# Patient Record
Sex: Female | Born: 1965
Health system: Southern US, Community
[De-identification: ages and names within clinical notes are randomized; demographics above are authoritative.]

---

## 2000-07-18 ENCOUNTER — Encounter (INDEPENDENT_AMBULATORY_CARE_PROVIDER_SITE_OTHER): Payer: Self-pay

## 2000-07-18 ENCOUNTER — Other Ambulatory Visit: Admission: RE | Admit: 2000-07-18 | Discharge: 2000-07-18 | Payer: Self-pay | Admitting: Obstetrics

## 2008-06-19 ENCOUNTER — Ambulatory Visit (HOSPITAL_COMMUNITY): Admission: RE | Admit: 2008-06-19 | Discharge: 2008-06-19 | Payer: Self-pay | Admitting: Obstetrics and Gynecology

## 2008-07-18 ENCOUNTER — Ambulatory Visit (HOSPITAL_COMMUNITY): Admission: RE | Admit: 2008-07-18 | Discharge: 2008-07-18 | Payer: Self-pay | Admitting: Obstetrics and Gynecology

## 2008-07-24 ENCOUNTER — Ambulatory Visit (HOSPITAL_COMMUNITY): Admission: RE | Admit: 2008-07-24 | Discharge: 2008-07-24 | Payer: Self-pay | Admitting: Obstetrics and Gynecology

## 2008-08-08 ENCOUNTER — Ambulatory Visit (HOSPITAL_COMMUNITY): Admission: RE | Admit: 2008-08-08 | Discharge: 2008-08-08 | Payer: Self-pay | Admitting: Obstetrics and Gynecology

## 2008-08-09 ENCOUNTER — Ambulatory Visit (HOSPITAL_COMMUNITY): Admission: RE | Admit: 2008-08-09 | Discharge: 2008-08-09 | Payer: Self-pay | Admitting: Obstetrics and Gynecology

## 2008-08-14 ENCOUNTER — Ambulatory Visit (HOSPITAL_COMMUNITY): Admission: RE | Admit: 2008-08-14 | Discharge: 2008-08-14 | Payer: Self-pay | Admitting: Obstetrics and Gynecology

## 2008-08-16 ENCOUNTER — Ambulatory Visit (HOSPITAL_COMMUNITY): Admission: RE | Admit: 2008-08-16 | Discharge: 2008-08-16 | Payer: Self-pay | Admitting: Obstetrics and Gynecology

## 2008-08-19 ENCOUNTER — Ambulatory Visit (HOSPITAL_COMMUNITY): Admission: RE | Admit: 2008-08-19 | Discharge: 2008-08-19 | Payer: Self-pay | Admitting: Obstetrics and Gynecology

## 2008-08-22 ENCOUNTER — Ambulatory Visit (HOSPITAL_COMMUNITY): Admission: RE | Admit: 2008-08-22 | Discharge: 2008-08-22 | Payer: Self-pay | Admitting: Obstetrics and Gynecology

## 2008-08-26 ENCOUNTER — Ambulatory Visit (HOSPITAL_COMMUNITY): Admission: RE | Admit: 2008-08-26 | Discharge: 2008-08-26 | Payer: Self-pay | Admitting: Obstetrics and Gynecology

## 2008-08-29 ENCOUNTER — Ambulatory Visit (HOSPITAL_COMMUNITY): Admission: RE | Admit: 2008-08-29 | Discharge: 2008-08-29 | Payer: Self-pay | Admitting: Obstetrics and Gynecology

## 2008-09-02 ENCOUNTER — Ambulatory Visit (HOSPITAL_COMMUNITY): Admission: RE | Admit: 2008-09-02 | Discharge: 2008-09-02 | Payer: Self-pay | Admitting: Obstetrics and Gynecology

## 2008-09-05 ENCOUNTER — Ambulatory Visit (HOSPITAL_COMMUNITY): Admission: RE | Admit: 2008-09-05 | Discharge: 2008-09-05 | Payer: Self-pay | Admitting: Obstetrics and Gynecology

## 2008-09-05 ENCOUNTER — Ambulatory Visit: Payer: Self-pay | Admitting: Obstetrics & Gynecology

## 2008-09-12 ENCOUNTER — Ambulatory Visit (HOSPITAL_COMMUNITY): Admission: RE | Admit: 2008-09-12 | Discharge: 2008-09-12 | Payer: Self-pay | Admitting: Obstetrics and Gynecology

## 2008-09-12 ENCOUNTER — Ambulatory Visit: Payer: Self-pay | Admitting: Family Medicine

## 2008-09-17 ENCOUNTER — Inpatient Hospital Stay (HOSPITAL_COMMUNITY): Admission: AD | Admit: 2008-09-17 | Discharge: 2008-09-19 | Payer: Self-pay | Admitting: Obstetrics & Gynecology

## 2008-09-17 ENCOUNTER — Ambulatory Visit: Payer: Self-pay | Admitting: Obstetrics & Gynecology

## 2009-04-26 ENCOUNTER — Emergency Department (HOSPITAL_COMMUNITY): Admission: EM | Admit: 2009-04-26 | Discharge: 2009-04-26 | Payer: Self-pay | Admitting: Emergency Medicine

## 2010-04-15 LAB — URINE CULTURE

## 2010-04-15 LAB — URINALYSIS, ROUTINE W REFLEX MICROSCOPIC
Glucose, UA: NEGATIVE mg/dL
Ketones, ur: NEGATIVE mg/dL
Nitrite: NEGATIVE
Protein, ur: 30 mg/dL — AB

## 2010-04-15 LAB — GC/CHLAMYDIA PROBE AMP, GENITAL
Chlamydia, DNA Probe: NEGATIVE
GC Probe Amp, Genital: NEGATIVE

## 2010-04-15 LAB — URINE MICROSCOPIC-ADD ON

## 2010-05-02 LAB — CBC
HCT: 35.7 % — ABNORMAL LOW (ref 36.0–46.0)
Hemoglobin: 12.1 g/dL (ref 12.0–15.0)
MCHC: 34 g/dL (ref 30.0–36.0)
MCV: 89.4 fL (ref 78.0–100.0)
MCV: 90.1 fL (ref 78.0–100.0)
RBC: 3.78 MIL/uL — ABNORMAL LOW (ref 3.87–5.11)
RBC: 4 MIL/uL (ref 3.87–5.11)
RDW: 15.2 % (ref 11.5–15.5)
WBC: 16 10*3/uL — ABNORMAL HIGH (ref 4.0–10.5)

## 2010-05-02 LAB — POCT URINALYSIS DIP (DEVICE)
Bilirubin Urine: NEGATIVE
Bilirubin Urine: NEGATIVE
Glucose, UA: NEGATIVE mg/dL
Glucose, UA: NEGATIVE mg/dL
Hgb urine dipstick: NEGATIVE
Hgb urine dipstick: NEGATIVE
Ketones, ur: NEGATIVE mg/dL
Ketones, ur: NEGATIVE mg/dL
Nitrite: NEGATIVE
Specific Gravity, Urine: 1.015 (ref 1.005–1.030)
Specific Gravity, Urine: 1.015 (ref 1.005–1.030)
pH: 8 (ref 5.0–8.0)

## 2010-10-30 IMAGING — US US OB FOLLOW-UP
1 series · 14 of 28 positions shown · non-contrast
Comparison: none

OBSTETRICAL ULTRASOUND:
 This ultrasound was performed in The [HOSPITAL], and the [facility] report will be stored to [REDACTED] PACS.

[Series 1: us ob follow-up · 14 of 39 slices shown]
[im 2/39]
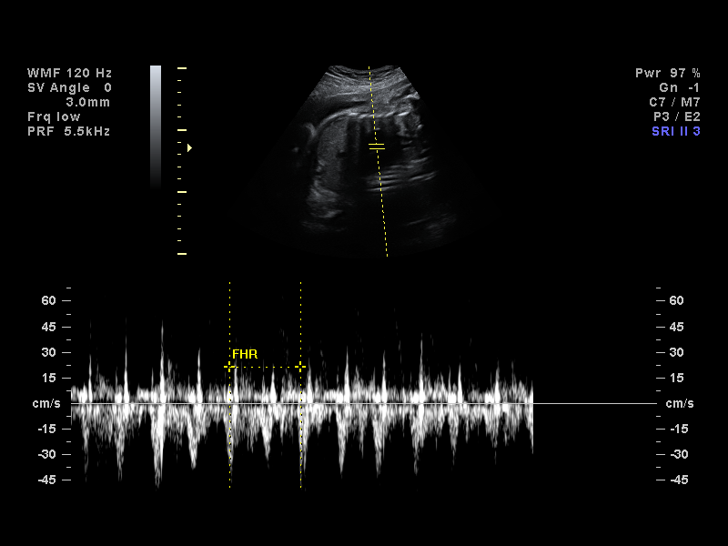
[im 5/39]
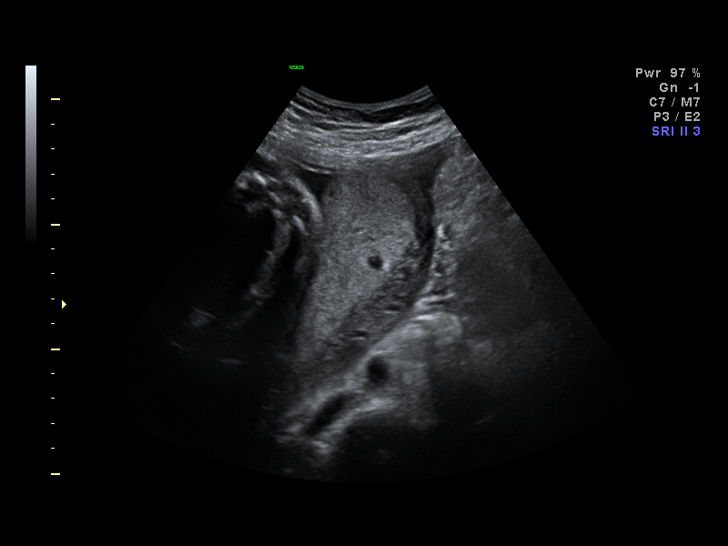
[im 8/39]
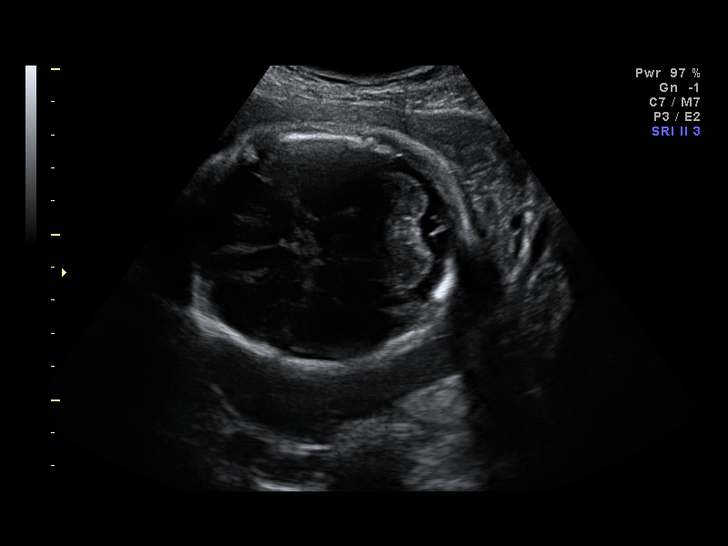
[im 10/39]
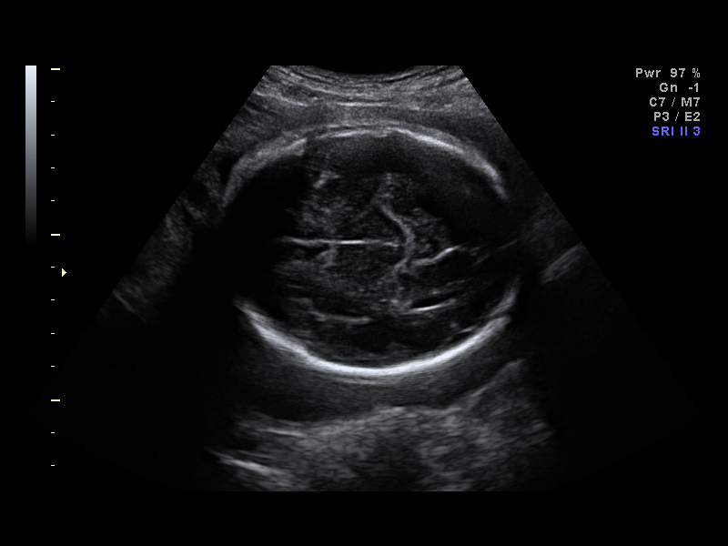
[im 13/39]
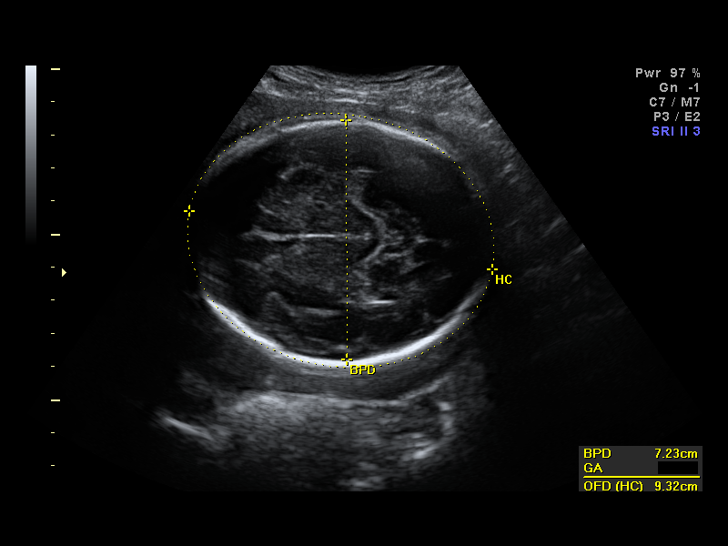
[im 16/39]
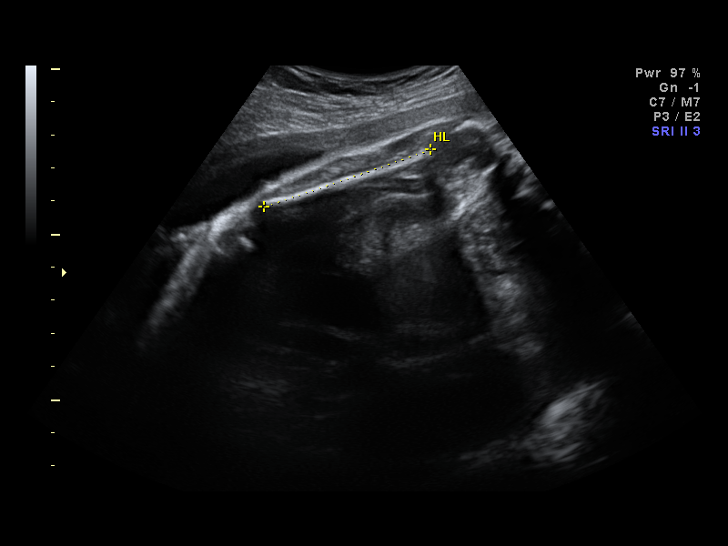
[im 19/39]
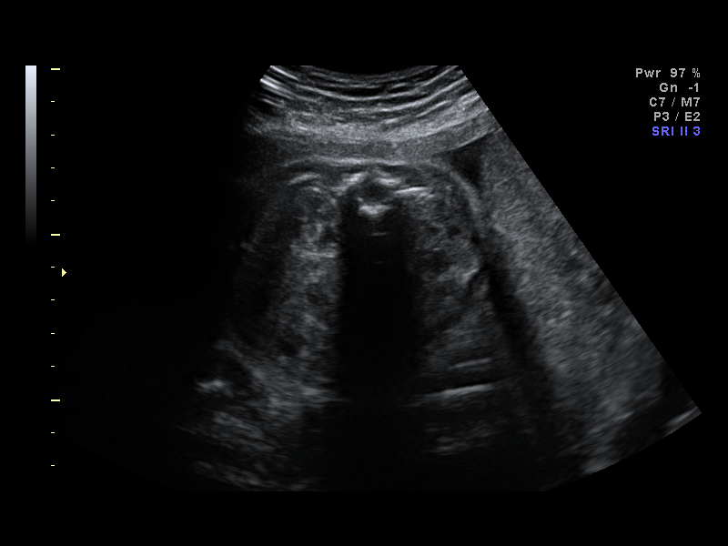
[im 22/39]
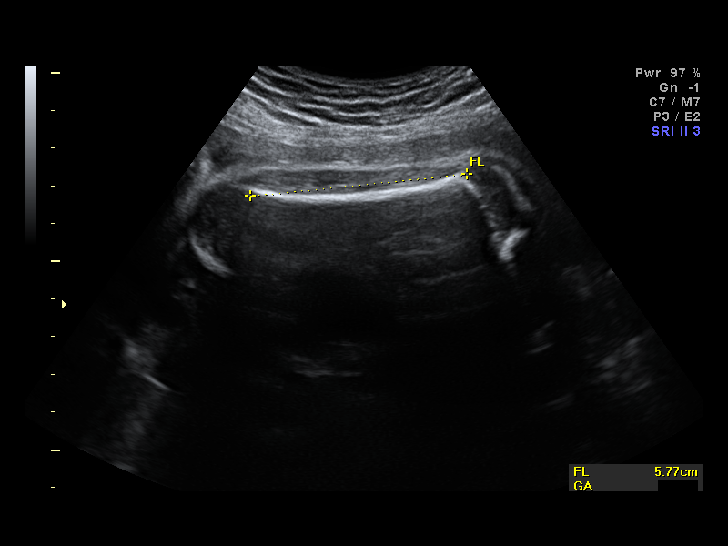
[im 24/39]
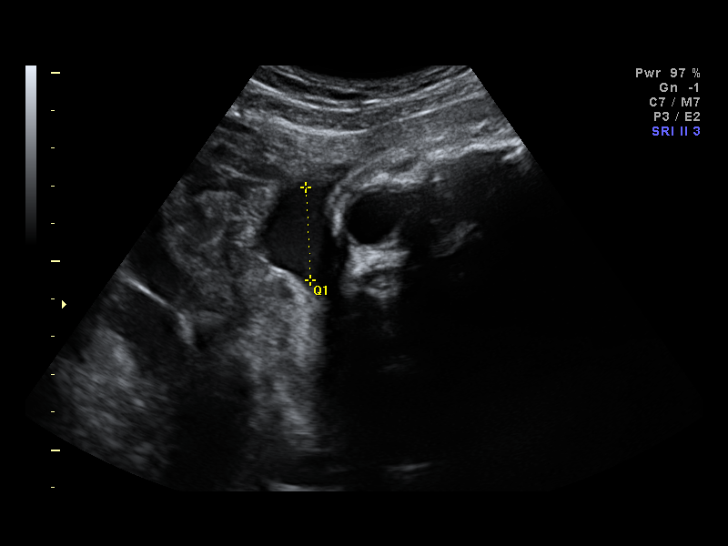
[im 27/39]
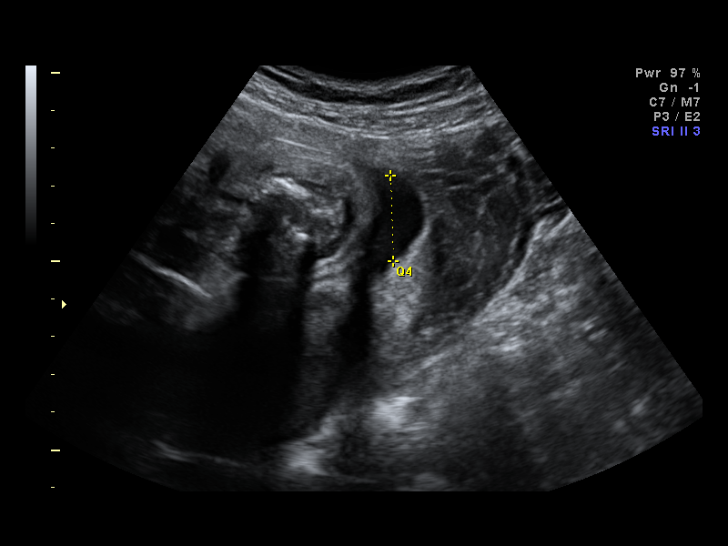
[im 30/39]
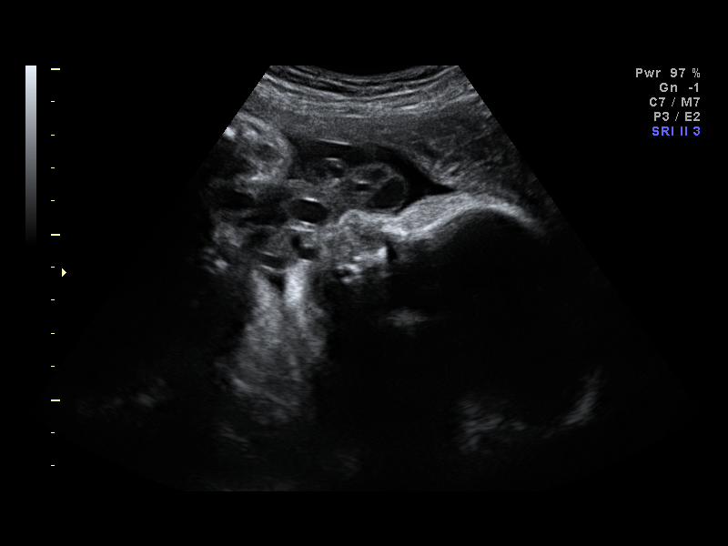
[im 33/39]
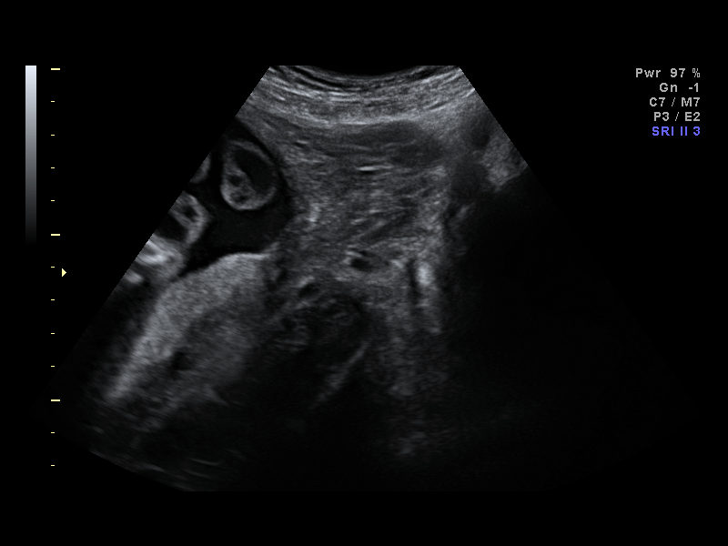
[im 36/39]
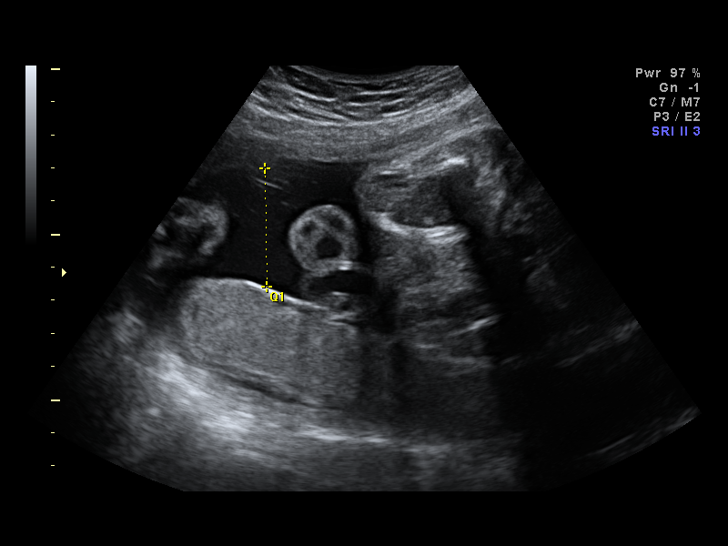
[im 39/39]
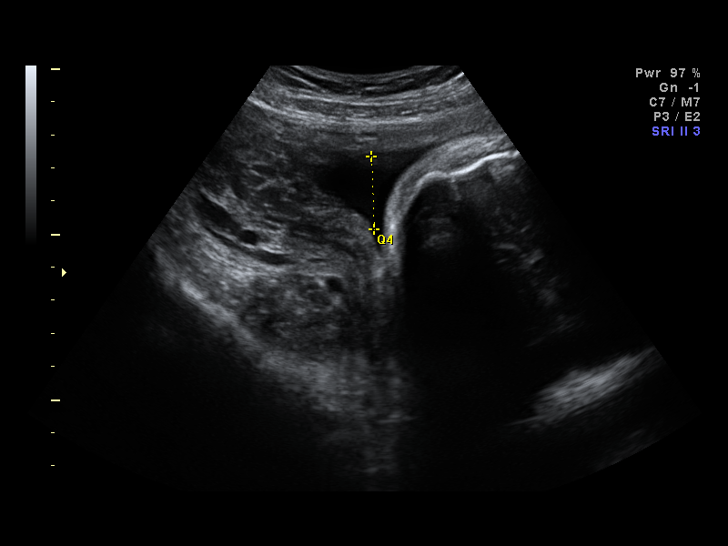

[14 of 28 positions shown; findings below may reference images not displayed]

IMPRESSION: [facility] has also been faxed to the ordering physician.

## 2010-11-20 IMAGING — US US OB FOLLOW-UP
1 series · 14 of 28 positions shown · non-contrast
Comparison: none

OBSTETRICAL ULTRASOUND:
 This ultrasound was performed in The [[facility]OSP[facility]], and the [facility] report will be stored to [REDACTED] PACS.

[Series 1: us ob follow-up · 14 of 33 slices shown]
[im 2/33]
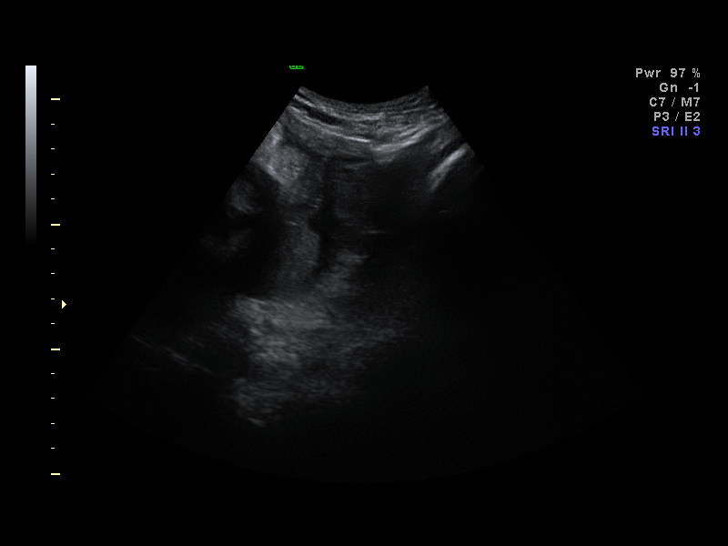
[im 4/33]
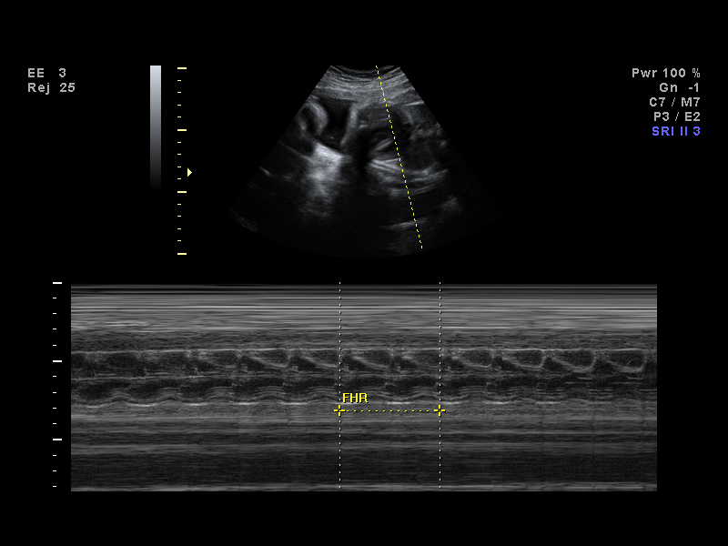
[im 6/33]
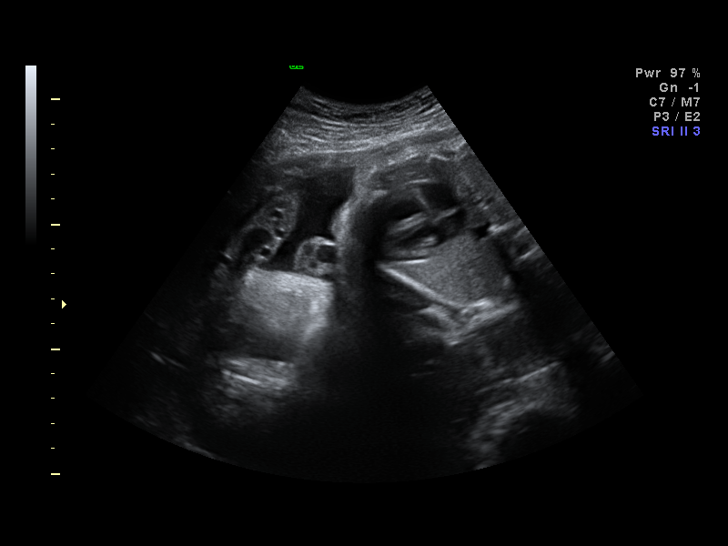
[im 9/33]
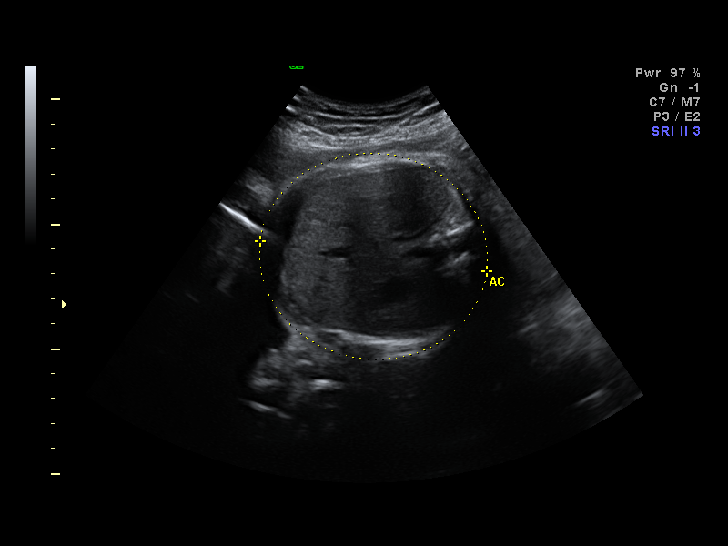
[im 11/33]
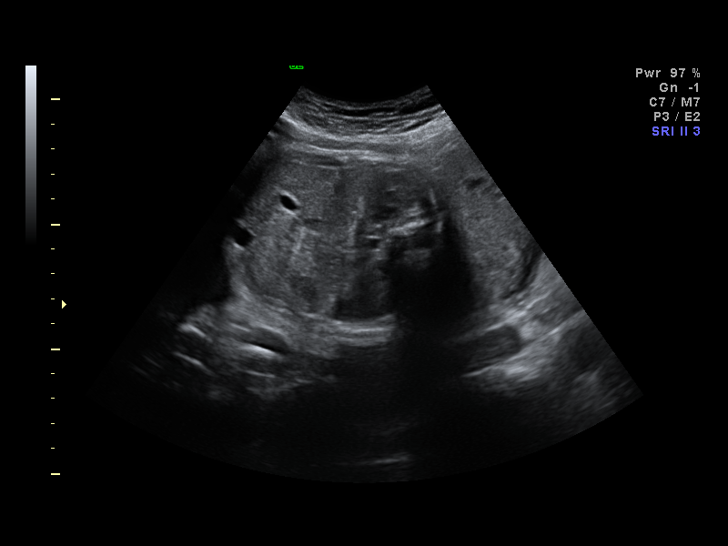
[im 14/33]
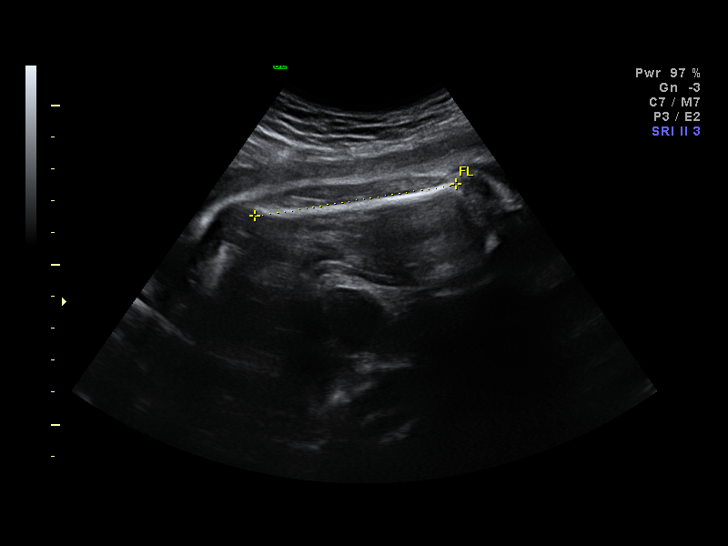
[im 16/33]
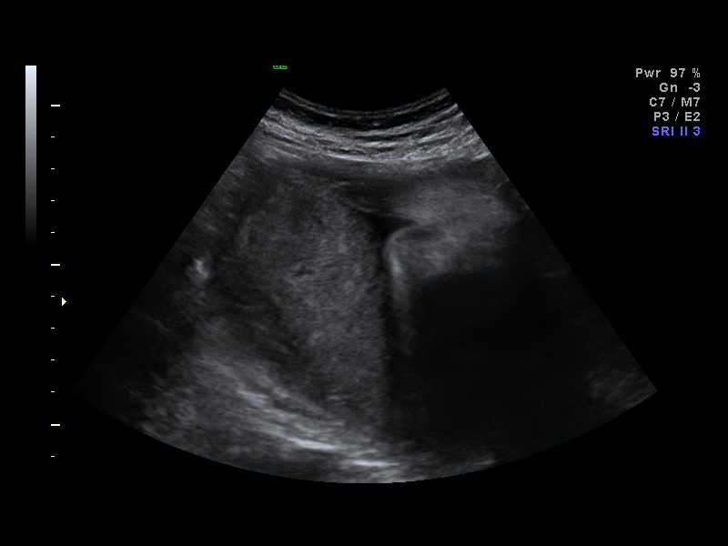
[im 18/33]
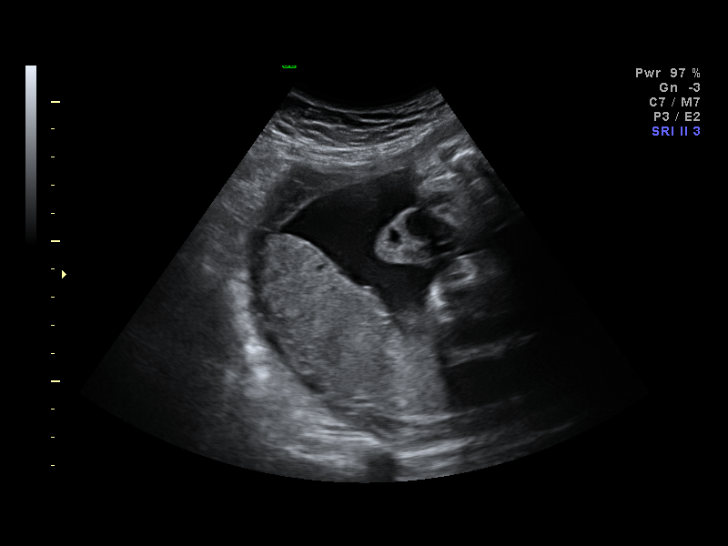
[im 21/33]
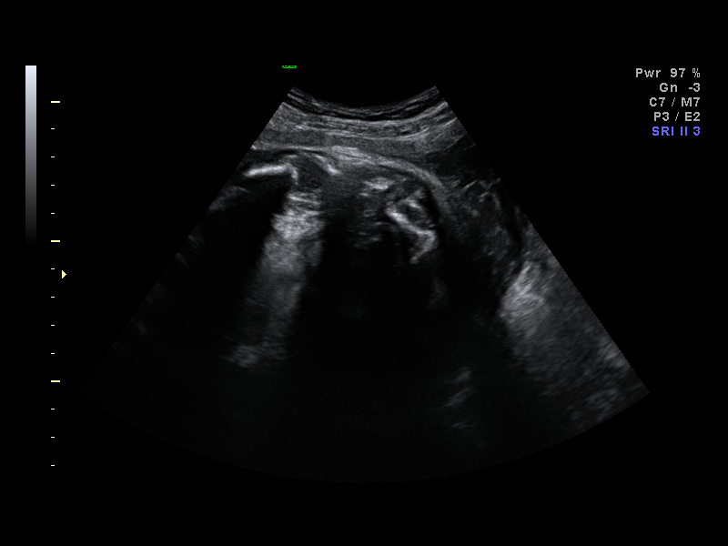
[im 23/33]
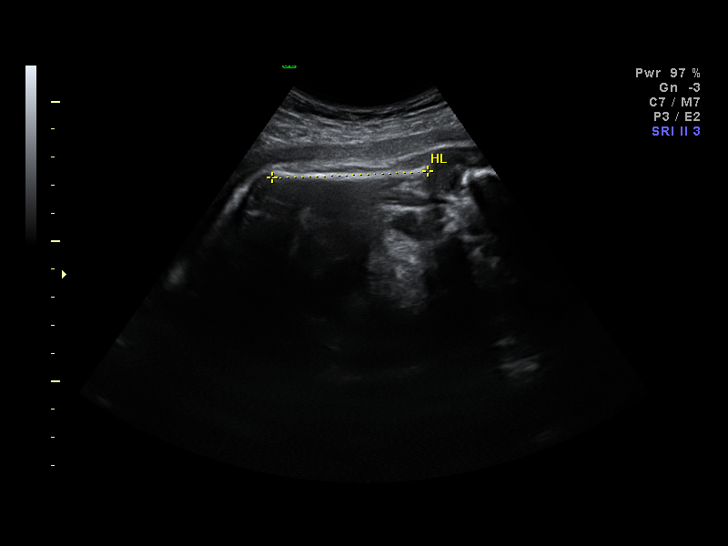
[im 25/33]
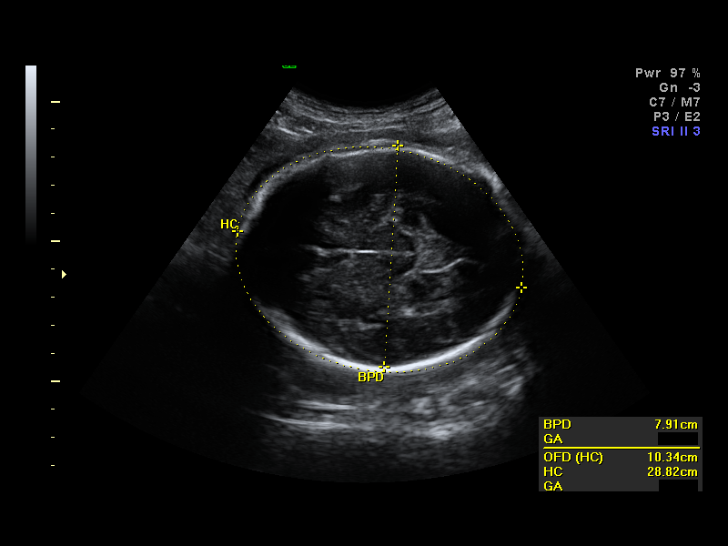
[im 28/33]
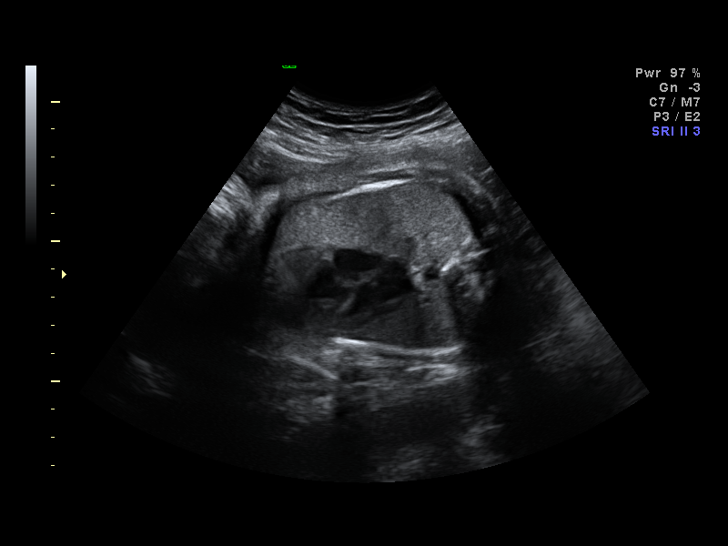
[im 30/33]
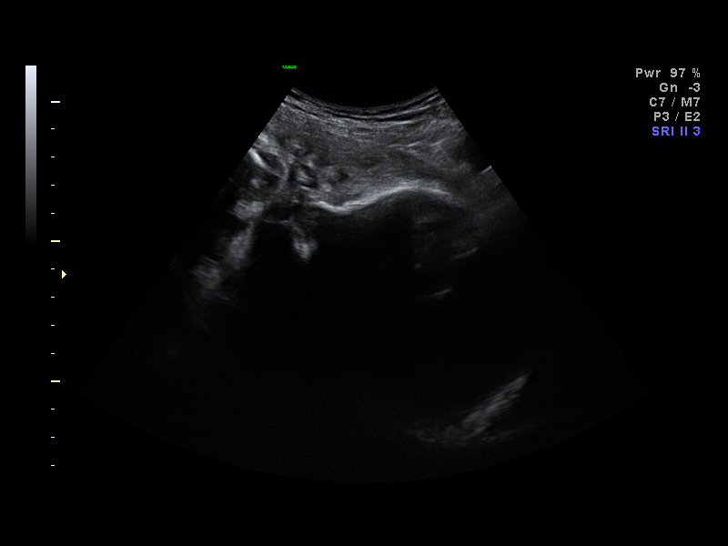
[im 33/33]
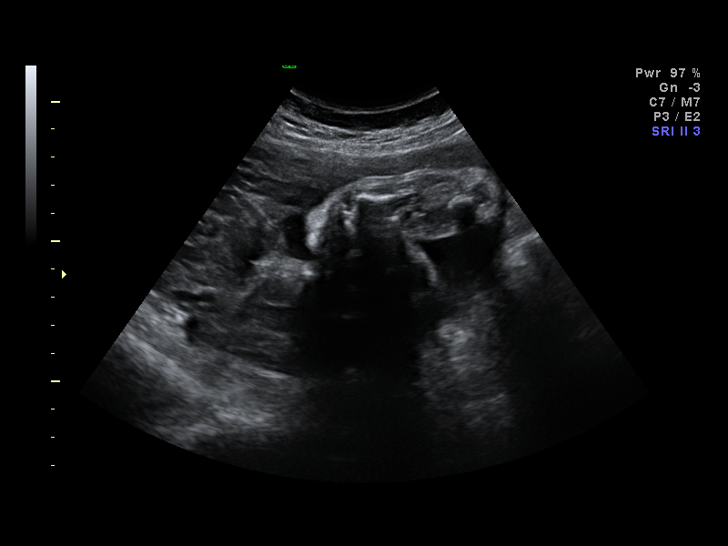

[14 of 28 positions shown; findings below may reference images not displayed]

IMPRESSION: [facility] has also been faxed to the ordering physician.

## 2010-12-04 IMAGING — US US OB FOLLOW-UP
1 series · 14 of 22 positions shown · non-contrast
Comparison: none

OBSTETRICAL ULTRASOUND:
 This ultrasound was performed in The [[facility]OSP[facility]], and the [facility] report will be stored to [REDACTED] PACS.

[Series 1: us ob follow-up · 14 of 22 slices shown]
[im 1/22]
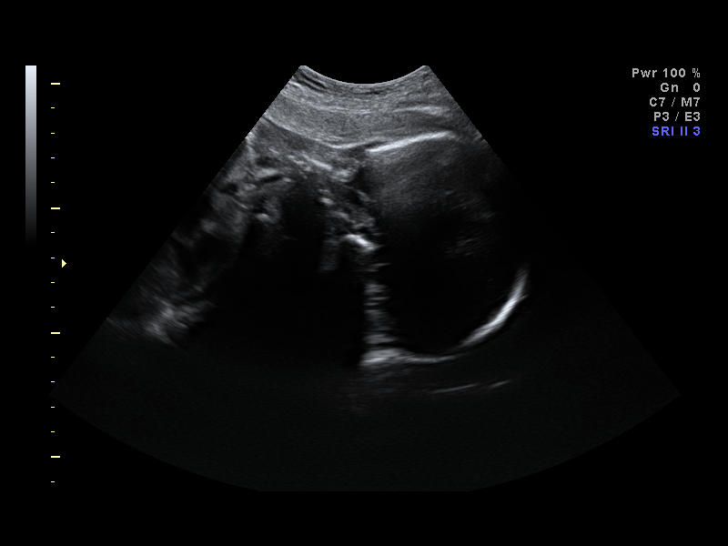
[im 3/22]
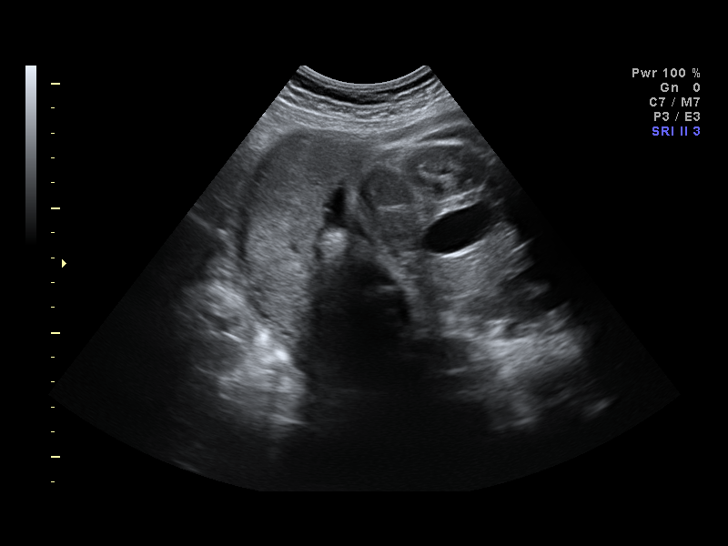
[im 4/22]
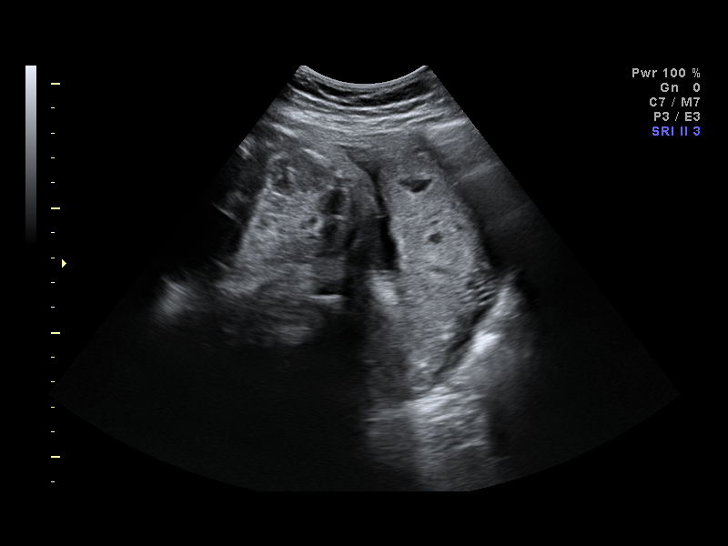
[im 6/22]
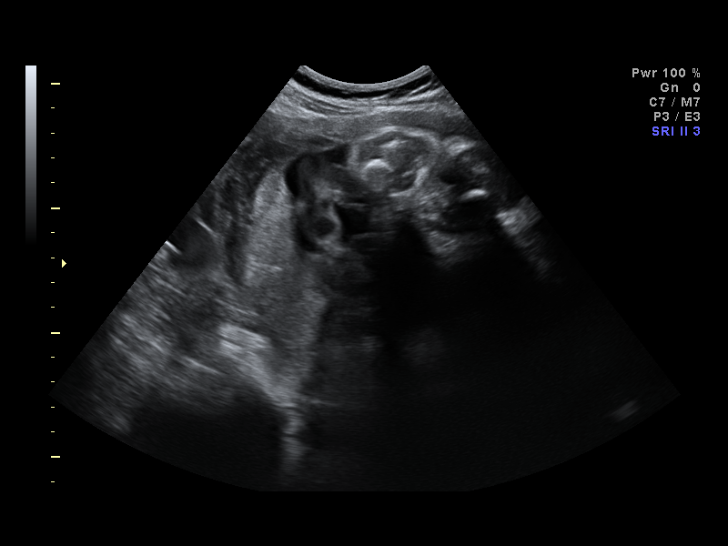
[im 8/22]
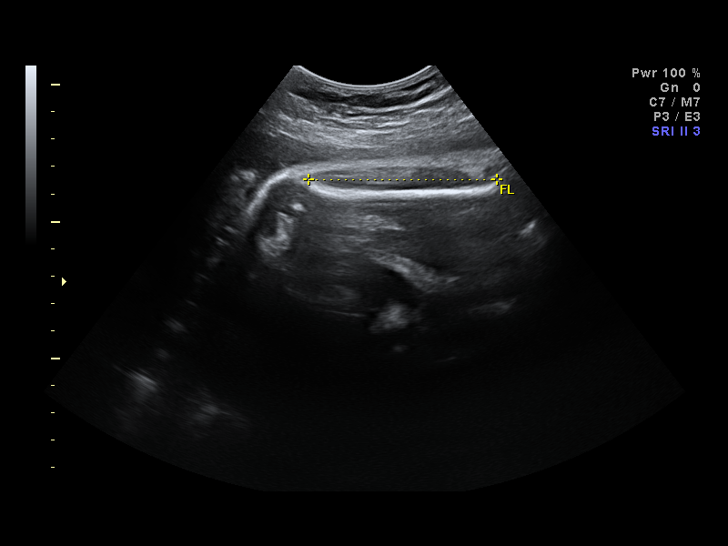
[im 9/22]
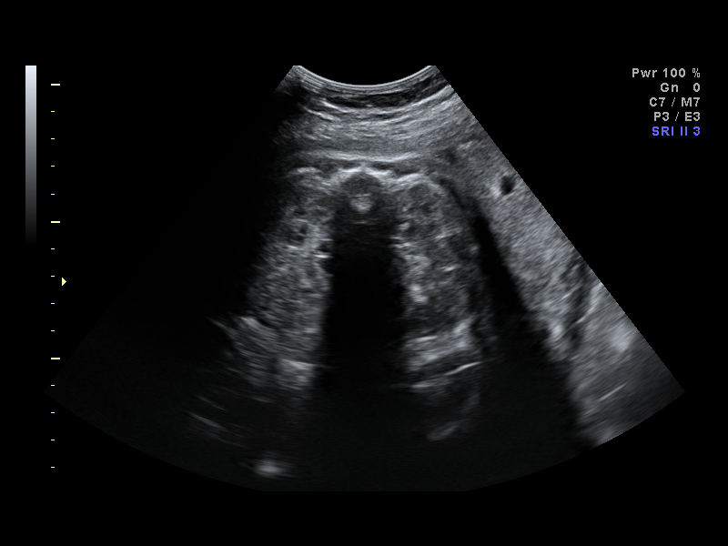
[im 11/22]
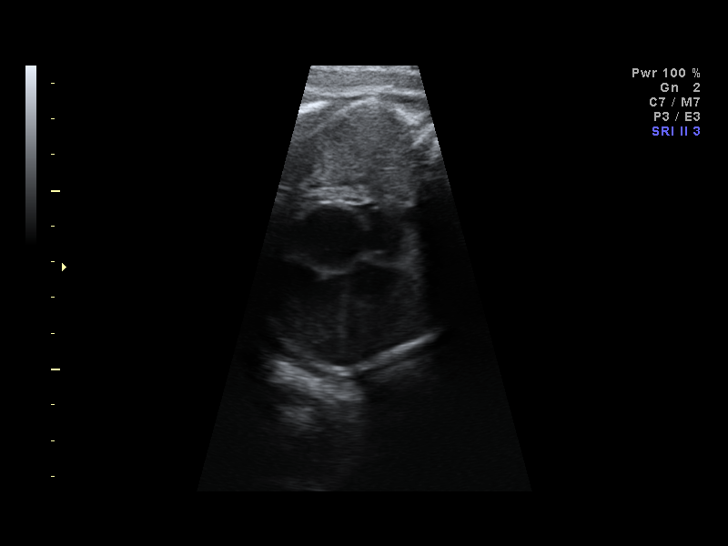
[im 12/22]
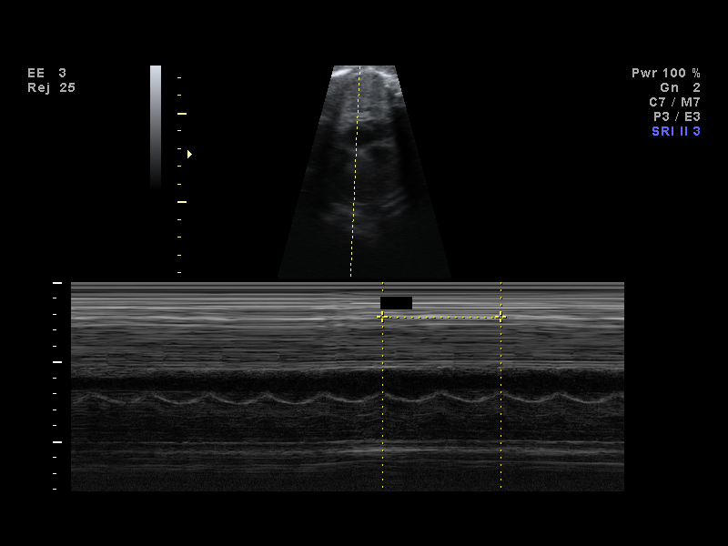
[im 14/22]
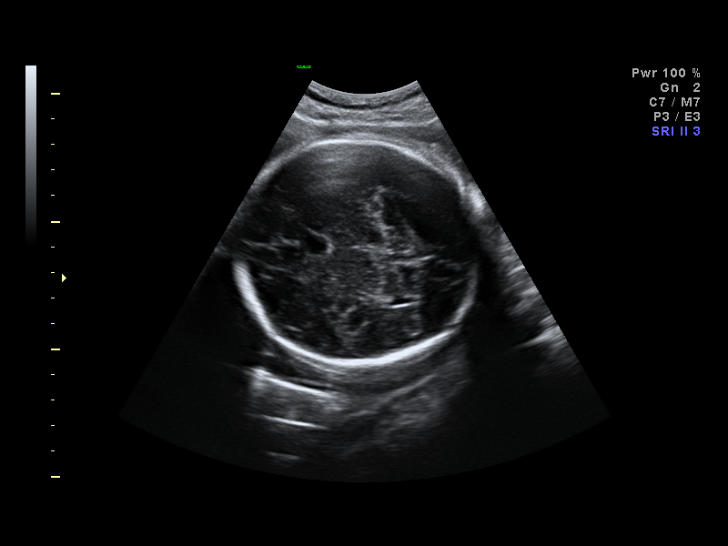
[im 15/22]
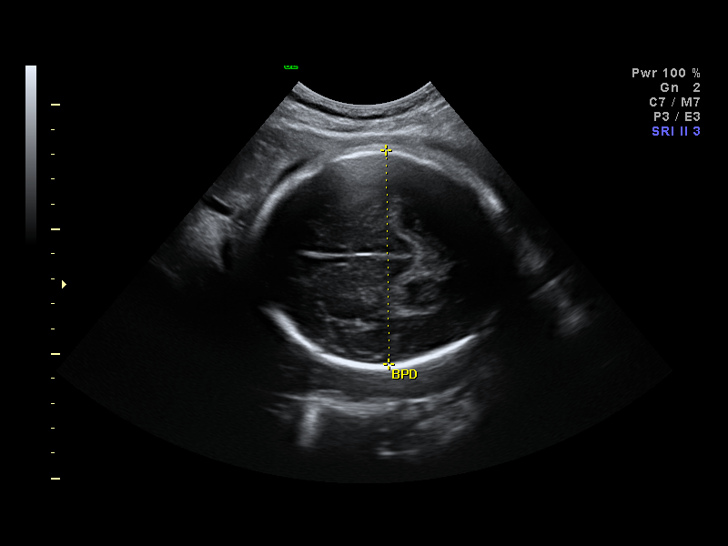
[im 17/22]
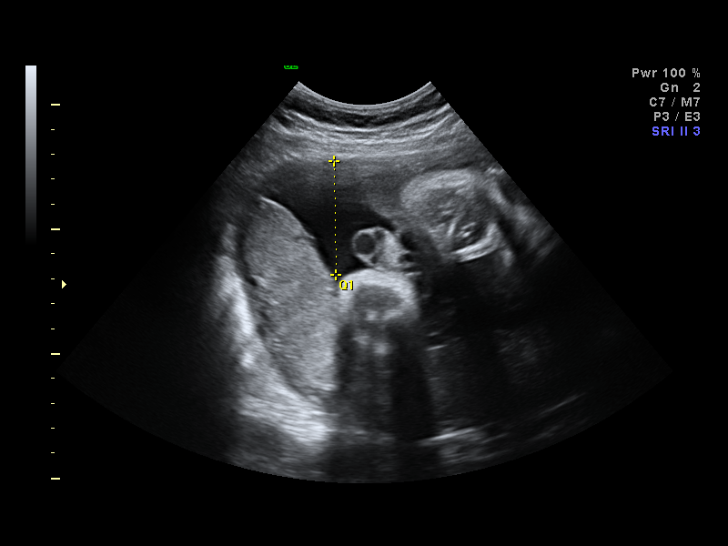
[im 19/22]
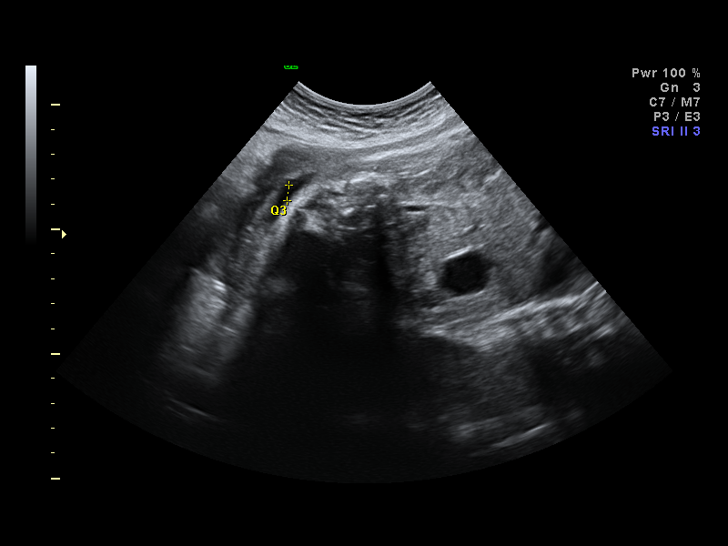
[im 20/22]
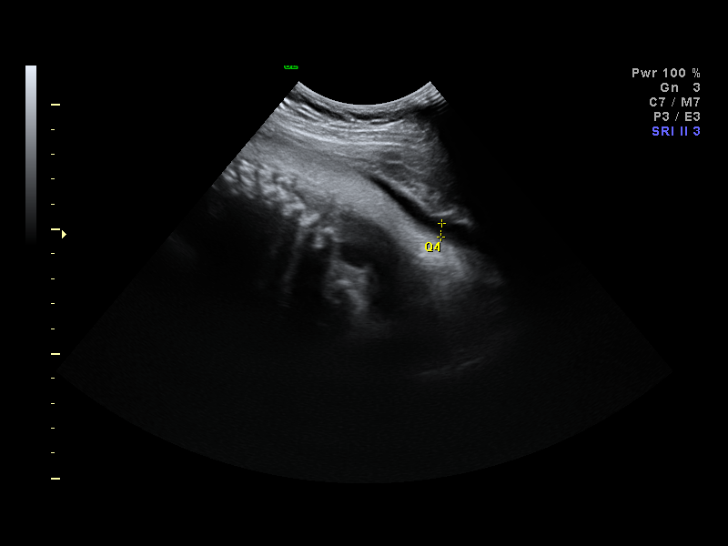
[im 22/22]
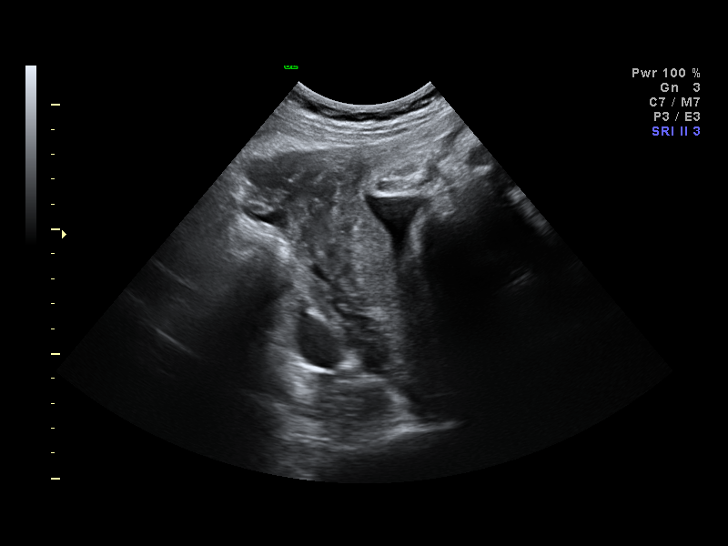

[14 of 22 positions shown; findings below may reference images not displayed]

IMPRESSION: [facility] has also been faxed to the ordering physician.

## 2012-09-26 ENCOUNTER — Emergency Department (HOSPITAL_COMMUNITY): Payer: Medicaid Other

## 2012-09-26 ENCOUNTER — Emergency Department (HOSPITAL_COMMUNITY)
Admission: EM | Admit: 2012-09-26 | Discharge: 2012-09-26 | Disposition: A | Discharge status: Home / Self Care | Payer: Medicaid Other | Attending: Emergency Medicine | Admitting: Emergency Medicine

## 2012-09-26 ENCOUNTER — Encounter (HOSPITAL_COMMUNITY): Payer: Self-pay | Admitting: Emergency Medicine

## 2012-09-26 DIAGNOSIS — J189 Pneumonia, unspecified organism: Secondary | ICD-10-CM

## 2012-09-26 DIAGNOSIS — R42 Dizziness and giddiness: Secondary | ICD-10-CM | POA: Insufficient documentation

## 2012-09-26 DIAGNOSIS — R55 Syncope and collapse: Secondary | ICD-10-CM | POA: Insufficient documentation

## 2012-09-26 DIAGNOSIS — J159 Unspecified bacterial pneumonia: Secondary | ICD-10-CM | POA: Insufficient documentation

## 2012-09-26 LAB — BASIC METABOLIC PANEL
BUN: 12 mg/dL (ref 6–23)
CO2: 20 mEq/L (ref 19–32)
Calcium: 8.5 mg/dL (ref 8.4–10.5)
GFR calc non Af Amer: >90 mL/min (ref >90)
Glucose, Bld: 110 mg/dL — ABNORMAL HIGH (ref 70–99)

## 2012-09-26 LAB — CBC WITH DIFFERENTIAL/PLATELET
Eosinophils Absolute: 0.2 10*3/uL (ref 0.0–0.7)
Eosinophils Relative: 1 % (ref 0–5)
HCT: 38.5 % (ref 36.0–46.0)
Hemoglobin: 13.7 g/dL (ref 12.0–15.0)
Lymphs Abs: 0.7 10*3/uL (ref 0.7–4.0)
MCH: 29.4 pg (ref 26.0–34.0)
MCV: 82.6 fL (ref 78.0–100.0)
Monocytes Relative: 3 % (ref 3–12)
RBC: 4.66 MIL/uL (ref 3.87–5.11)

## 2012-09-26 LAB — URINALYSIS, ROUTINE W REFLEX MICROSCOPIC
Bilirubin Urine: NEGATIVE
Glucose, UA: NEGATIVE mg/dL
Protein, ur: NEGATIVE mg/dL
Specific Gravity, Urine: 1.01 (ref 1.005–1.030)
Urobilinogen, UA: 0.2 mg/dL (ref 0.0–1.0)

## 2012-09-26 LAB — URINE MICROSCOPIC-ADD ON

## 2012-09-26 MED ORDER — IOHEXOL 350 MG/ML SOLN
100.0000 mL | Freq: Once | INTRAVENOUS | Status: AC | PRN
  Administered 2012-09-26: 60 mL via INTRAVENOUS

## 2012-09-26 MED ORDER — SODIUM CHLORIDE 0.9 % IV BOLUS (SEPSIS)
1000.0000 mL | Freq: Once | INTRAVENOUS | Status: AC
  Administered 2012-09-26: 1000 mL via INTRAVENOUS

## 2012-09-26 MED ORDER — AZITHROMYCIN 250 MG PO TABS
500.0000 mg | ORAL_TABLET | Freq: Once | ORAL | Status: AC
  Administered 2012-09-26: 500 mg via ORAL
  Filled 2012-09-26: qty 2

## 2012-09-26 MED ORDER — AZITHROMYCIN 250 MG PO TABS: ORAL_TABLET | ORAL | Status: DC

## 2012-09-26 NOTE — ED Notes (Signed)
Patient transported to CT 

## 2012-09-26 NOTE — ED Provider Notes (Signed)
CTA of chest results reviewed, shared with patient.  No indication of pulmonary embolism, however findings of ground glass opacity in LLL suspicious for pneumonia. Will treat as CAP.    Jimmye Norman, NP 09/26/12 1659

## 2012-09-26 NOTE — ED Notes (Addendum)
Per EMS: pt from dr office. Has had cough for 3 days.  Took cough medicine. Vomiting x1.  ems gave 4mg  zofran for nausea. Pt seems very anxious. 12 lead unremarkable. Sx changed several times.  VSS.  Pt states she took a lot of cough medicine and drank coffee this morning.

## 2012-09-26 NOTE — ED Notes (Signed)
Report received, assumed care.  

## 2012-09-26 NOTE — ED Provider Notes (Signed)
CSN: [id number]     Arrival date & time 09/26/12  1252 History   First MD Initiated Contact with Patient 09/26/12 1255     Chief Complaint  Patient presents with  . Anxiety   (Consider location/radiation/quality/duration/timing/severity/associated sxs/prior Treatment) HPI Comments: Patient is a 47 year old female with no past medical history who presents after a syncopal episode that occurred prior to arrival. Patient reports having a 3 day history of cough and took some cough syrup this morning on an empty stomach. Patient reports taking 2 cap-full which is a larger dose than normal. Patient reports she had her friend take her to her PCP and she started to feel lightheaded, SOB, and anxious. Patient then had a witnessed syncopal episode and her PCP called 911. Patient reports still feeling like it is "hard to breath" and lightheaded. No aggravating/alleviating factors. No associated symptoms. Patient denies any oral hormone use.     History reviewed. No pertinent past medical history. History reviewed. No pertinent past surgical history. No family history on file. History  Substance Use Topics  . Smoking status: Never Smoker   . Smokeless tobacco: Never Used  . Alcohol Use: No   OB History   Grav Para Term Preterm Abortions TAB SAB Ect Mult Living                 Review of Systems  Respiratory: Positive for shortness of breath.   Neurological: Positive for syncope and light-headedness.  Psychiatric/Behavioral: The patient is nervous/anxious.   All other systems reviewed and are negative.    Allergies  Review of patient's allergies indicates no known allergies.  Home Medications   Current Outpatient Rx  Name  Route  Sig  Dispense  Refill  . Chlorpheniramine-DM (COUGH & COLD PO)   Oral   Take by mouth 2 (two) times daily as needed.          BP 112/65  Temp(Src) 97.4 F (36.3 C) (Oral)  Resp 17  SpO2 98%  LMP 09/10/2012 Physical Exam  Nursing note and vitals  reviewed. Constitutional: She is oriented to person, place, and time. She appears well-developed and well-nourished. No distress.  HENT:  Head: Normocephalic and atraumatic.  Eyes: Conjunctivae and EOM are normal.  Neck: Normal range of motion.  Cardiovascular: Normal rate and regular rhythm.  Exam reveals no gallop and no friction rub.   No murmur heard. Pulmonary/Chest: Effort normal and breath sounds normal. She has no wheezes. She has no rales. She exhibits no tenderness.  Abdominal: Soft. She exhibits no distension. There is no tenderness. There is no rebound and no guarding.  Musculoskeletal: Normal range of motion.  Neurological: She is alert and oriented to person, place, and time. Coordination normal.  Extremity strength and sensation equal and intact bilaterally. Speech is goal-oriented. Moves limbs without ataxia.   Skin: Skin is warm and dry.  Psychiatric: She has a normal mood and affect. Her behavior is normal.    ED Course  Procedures (including critical care time)   Date: 09/26/2012  Rate: 84  Rhythm: normal sinus rhythm  QRS Axis: normal  Intervals: QT prolonged  ST/T Wave abnormalities: normal  Conduction Disutrbances:none  Narrative Interpretation: NSR without acute changes  Old EKG Reviewed: none available   Labs Review Labs Reviewed  CBC WITH DIFFERENTIAL - Abnormal; Notable for the following:    WBC 12.4 (*)    Neutrophils Relative % 90 (*)    Neutro Abs 11.1 (*)    Lymphocytes Relative  6 (*)    All other components within normal limits  BASIC METABOLIC PANEL - Abnormal; Notable for the following:    Glucose, Bld 110 (*)    All other components within normal limits  URINALYSIS, ROUTINE W REFLEX MICROSCOPIC - Abnormal; Notable for the following:    Hgb urine dipstick SMALL (*)    Ketones, ur 15 (*)    All other components within normal limits  D-DIMER, QUANTITATIVE - Abnormal; Notable for the following:    D-Dimer, Quant 2.83 (*)    All other  components within normal limits  URINE CULTURE  URINE MICROSCOPIC-ADD ON   Imaging Review Dg Chest 2 View  09/26/2012   *RADIOLOGY REPORT*  Clinical Data: Shortness of breath, cough, chest pain  CHEST - 2 VIEW  Comparison:  None.  Findings:  The heart size and mediastinal contours are within normal limits.  Both lungs are clear.  The visualized skeletal structures are unremarkable.  IMPRESSION: No active cardiopulmonary disease.   Original Report Authenticated By: Judie Petit. Miles Costain, M.D.   Ct Angio Chest Pe W/cm &/or Wo Cm  09/26/2012   *RADIOLOGY REPORT*  Clinical Data: Cough  CT ANGIOGRAPHY CHEST  Technique:  Multidetector CT imaging of the chest using the standard protocol during bolus administration of intravenous contrast. Multiplanar reconstructed images including MIPs were obtained and reviewed to evaluate the vascular anatomy.  Contrast: 60mL OMNIPAQUE IOHEXOL 350 MG/ML SOLN  Comparison: Chest radiographs dated 09/26/2012  Findings: No evidence of pulmonary embolism.  Mild patchy/ground-glass opacities at the left lung base (series 6/image 76), suspicious for pneumonia.  No pleural effusion or pneumothorax.  Visualized thyroid is unremarkable.  The heart is normal in size.  No pericardial effusion.  No suspicious mediastinal, hilar, or axillary lymphadenopathy.  Visualized upper abdomen is unremarkable.  Visualized osseous structures are within normal limits.  IMPRESSION: No evidence of pulmonary embolism.  Mild patchy/ground-glass opacities at the left lung base, suspicious for pneumonia.   Original Report Authenticated By: Charline Bills, M.D.    MDM   1. Community acquired pneumonia     2:03 PM Labs and urinalysis pending. Patient will have fluids and chest xray. Vitals stable and patient afebrile.   3:37 PM Labs unremarkable. Chest xray unremarkable. Patient's d-dimer elevated. Patient will have CT angio to rule out PE. Patient signed out to Felicie Morn, NP.      Emilia Beck,  PA-C 09/27/12 [phone]

## 2012-09-27 LAB — URINE CULTURE
Colony Count: NO GROWTH
Special Requests: NORMAL

## 2012-09-29 NOTE — ED Provider Notes (Signed)
Medical screening examination/treatment/procedure(s) were performed by non-physician practitioner and as supervising physician I was immediately available for consultation/collaboration.  Elexis Pollak T Carlei Huang, MD 09/29/12 0755 

## 2012-09-30 NOTE — ED Provider Notes (Signed)
Medical screening examination/treatment/procedure(s) were performed by non-physician practitioner and as supervising physician I was immediately available for consultation/collaboration.    Gilda Crease, MD 09/30/12 [phone]

## 2013-06-07 ENCOUNTER — Emergency Department (HOSPITAL_COMMUNITY)
Admission: EM | Admit: 2013-06-07 | Discharge: 2013-06-07 | Disposition: A | Discharge status: Home / Self Care | Payer: Medicaid Other | Source: Home / Self Care | Attending: Family Medicine | Admitting: Family Medicine

## 2013-06-07 ENCOUNTER — Encounter (HOSPITAL_COMMUNITY): Payer: Self-pay | Admitting: Emergency Medicine

## 2013-06-07 DIAGNOSIS — T148 Other injury of unspecified body region: Secondary | ICD-10-CM

## 2013-06-07 DIAGNOSIS — W57XXXA Bitten or stung by nonvenomous insect and other nonvenomous arthropods, initial encounter: Secondary | ICD-10-CM

## 2013-06-07 DIAGNOSIS — Z91038 Other insect allergy status: Secondary | ICD-10-CM

## 2013-06-07 MED ORDER — TRIAMCINOLONE ACETONIDE 40 MG/ML IJ SUSP
INTRAMUSCULAR | Status: AC
  Filled 2013-06-07: qty 1

## 2013-06-07 MED ORDER — FLUTICASONE PROPIONATE 0.05 % EX CREA: TOPICAL_CREAM | Freq: Two times a day (BID) | CUTANEOUS | Status: AC

## 2013-06-07 MED ORDER — TRIAMCINOLONE ACETONIDE 40 MG/ML IJ SUSP
40.0000 mg | Freq: Once | INTRAMUSCULAR | Status: AC
  Administered 2013-06-07: 40 mg via INTRAMUSCULAR

## 2013-06-07 NOTE — ED Provider Notes (Signed)
CSN: [id number]     Arrival date & time 06/07/13  1713 History   First MD Initiated Contact with Patient 06/07/13 1731     Chief Complaint  Patient presents with  . Insect Bite   (Consider location/radiation/quality/duration/timing/severity/associated sxs/prior Treatment) Patient is a 48 y.o. female presenting with rash. The history is provided by the patient.  Rash Location:  Torso Quality: blistering, itchiness and redness   Severity:  Moderate Onset quality:  Sudden Duration:  3 days Progression:  Unchanged Chronicity:  New Context: insect bite/sting   Associated symptoms: no fever, no nausea, not vomiting and not wheezing     History reviewed. No pertinent past medical history. History reviewed. No pertinent past surgical history. History reviewed. No pertinent family history. History  Substance Use Topics  . Smoking status: Never Smoker   . Smokeless tobacco: Never Used  . Alcohol Use: No   OB History   Grav Para Term Preterm Abortions TAB SAB Ect Mult Living                 Review of Systems  Constitutional: Negative.  Negative for fever.  Respiratory: Negative for wheezing.   Gastrointestinal: Negative for nausea and vomiting.  Skin: Positive for rash.    Allergies  Review of patient's allergies indicates no known allergies.  Home Medications   Prior to Admission medications   Medication Sig Start Date End Date Taking? Authorizing Provider  azithromycin (ZITHROMAX Z-PAK) 250 MG tablet Take one tablet once a day until completed. 09/26/12   Norman Herrlich, NP  Chlorpheniramine-DM (COUGH & COLD PO) Take by mouth 2 (two) times daily as needed.    Historical Provider, MD   BP 124/82  Pulse 65  Temp(Src) 97.6 F (36.4 C) (Oral)  Resp 16  SpO2 99% Physical Exam  Nursing note and vitals reviewed. Constitutional: She is oriented to person, place, and time. She appears well-developed and well-nourished. She appears distressed.  Neck: Normal range of motion.  Neck supple.  Pulmonary/Chest: Breath sounds normal.  Neurological: She is alert and oriented to person, place, and time.  Skin: Skin is warm and dry. Rash noted.  4-5 scattered erythematous pathes with central fine blisters, intensely pruritic.    ED Course  Procedures (including critical care time) Labs Review Labs Reviewed - No data to display  Imaging Review No results found.   MDM   1. Allergic to insect bites        Billy Fischer, MD 06/07/13 [phone]

## 2013-06-07 NOTE — ED Notes (Signed)
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 C/o itching is making her crazy

## 2015-01-08 IMAGING — CT CT ANGIO CHEST
2 of 7 series · 18 of 36 positions shown · IV contrast (CONTRAST)
Comparison: Chest radiographs dated 09/26/2012

CLINICAL DATA: Cough

CT ANGIOGRAPHY CHEST
TECHNIQUE: Multidetector CT imaging of the chest using the
standard protocol during bolus administration of intravenous
contrast. Multiplanar reconstructed images including MIPs were
obtained and reviewed to evaluate the vascular anatomy.
Contrast: 60mL OMNIPAQUE IOHEXOL 350 MG/ML SOLN

[Series 5: pe thins · axial · 0.68mm/px · z∈[-216,-5]mm · 17 of 239 slices shown]
[im 14/239  lung]
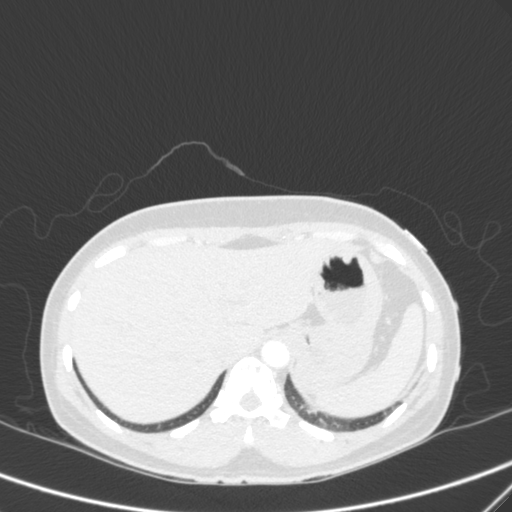
[im 27/239  mediastinal]
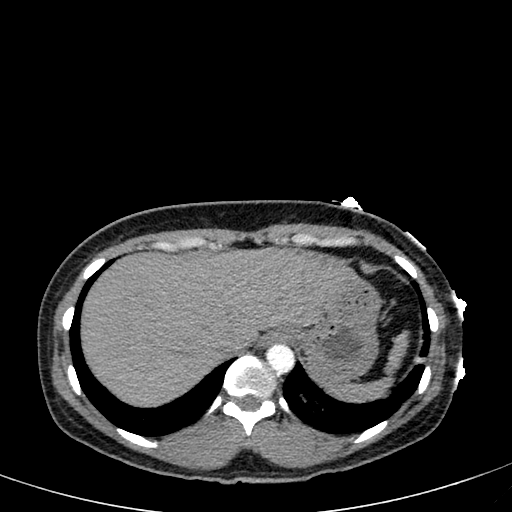
[im 40/239  lung]
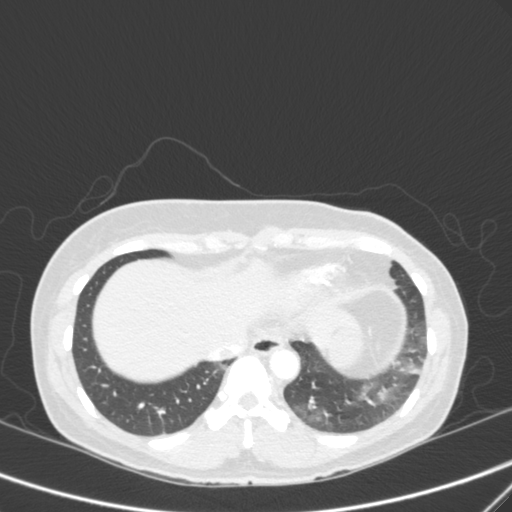
[im 53/239  mediastinal]
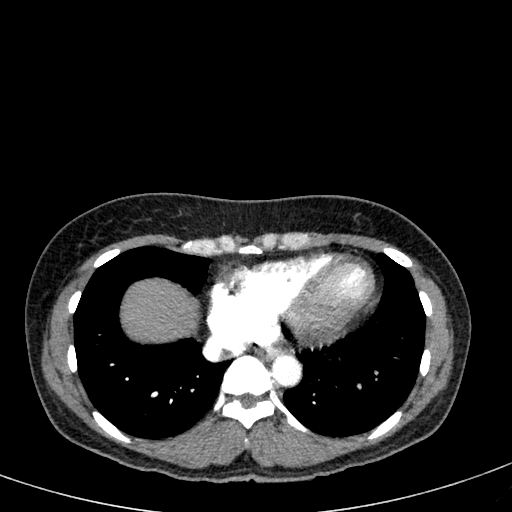
[im 67/239  lung]
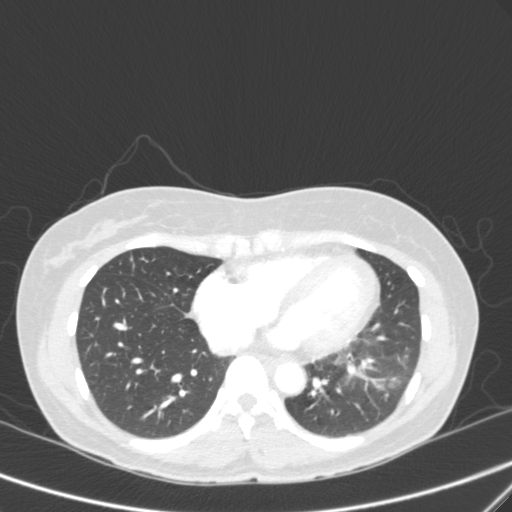
[im 80/239  mediastinal]
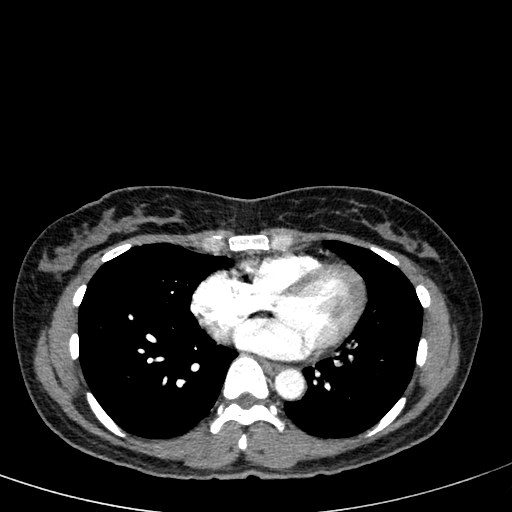
[im 93/239  lung]
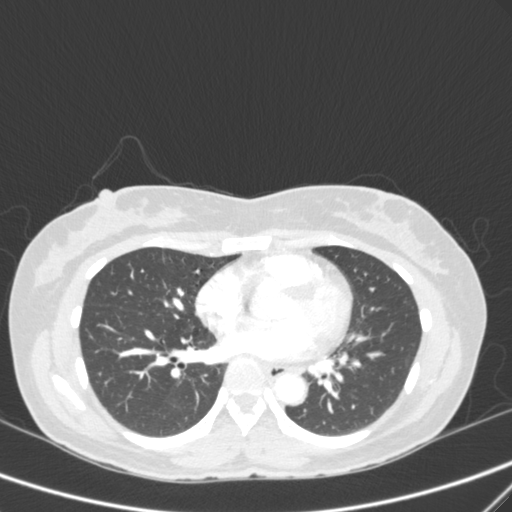
[im 106/239  mediastinal]
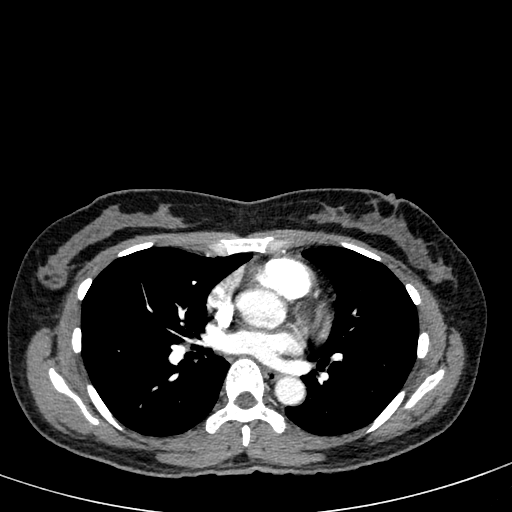
[im 120/239  lung]
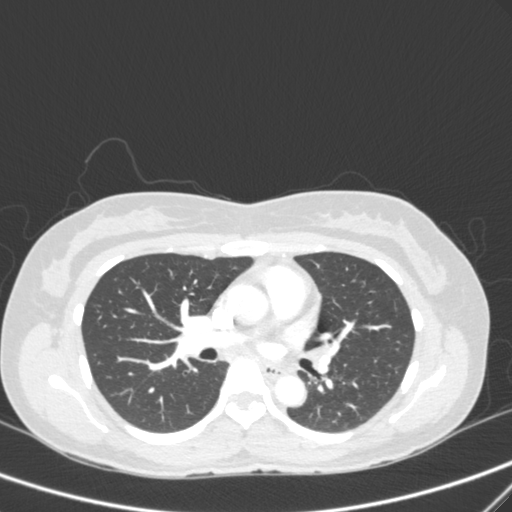
[im 133/239  mediastinal]
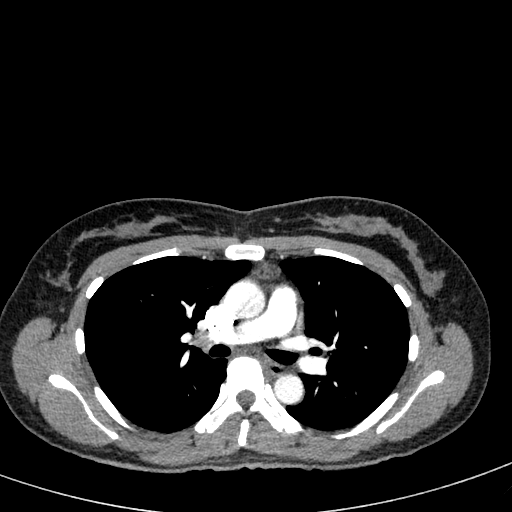
[im 146/239  lung]
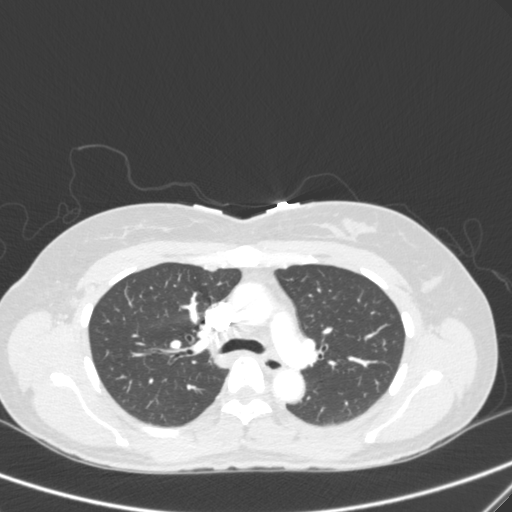
[im 159/239  mediastinal]
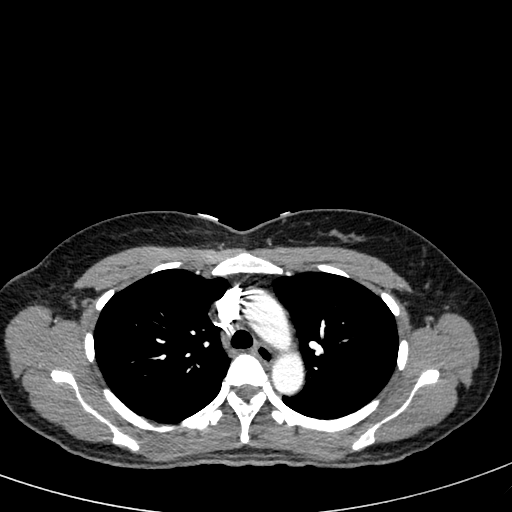
[im 172/239  lung]
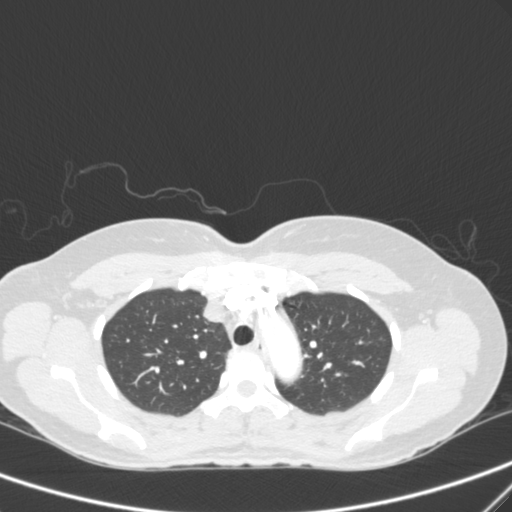
[im 186/239  mediastinal]
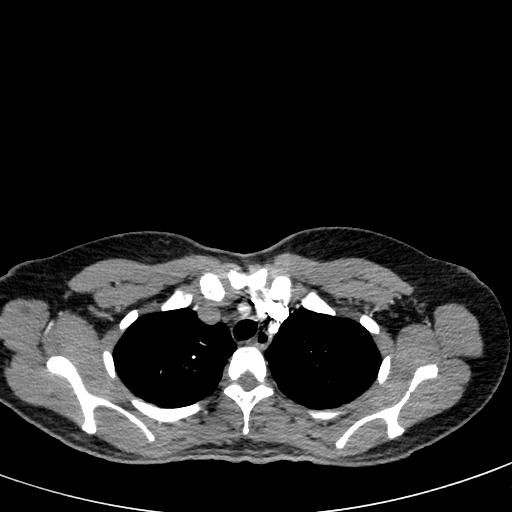
[im 199/239  lung]
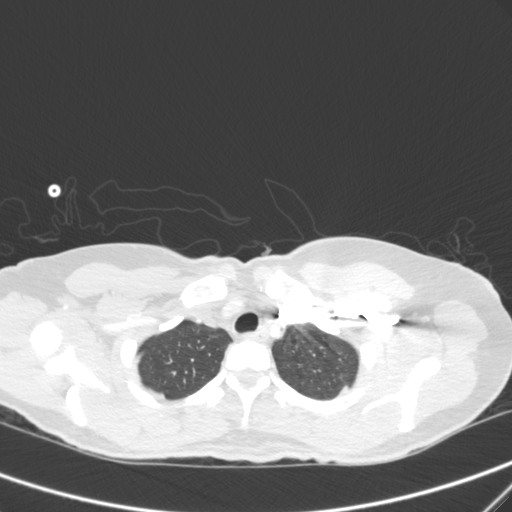
[im 212/239  mediastinal]
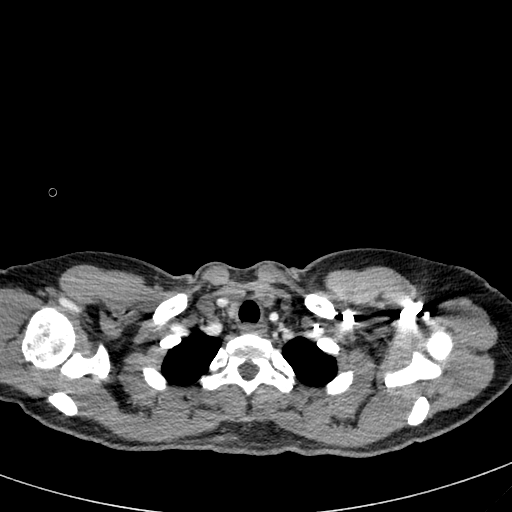
[im 225/239  lung]
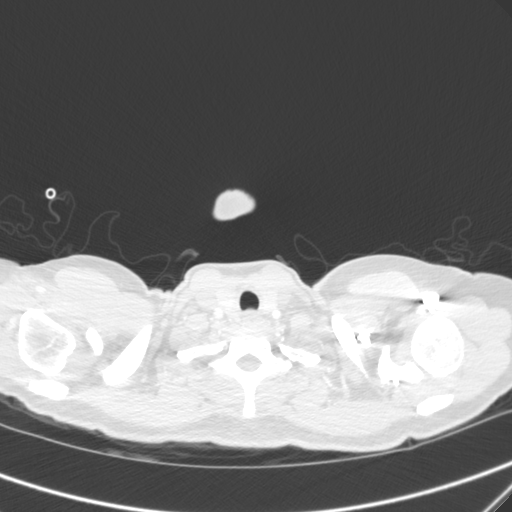

[mpr coronals · coronal · 0.68mm/px · 1 of 73 slices shown]
[im 37/73  mediastinal]
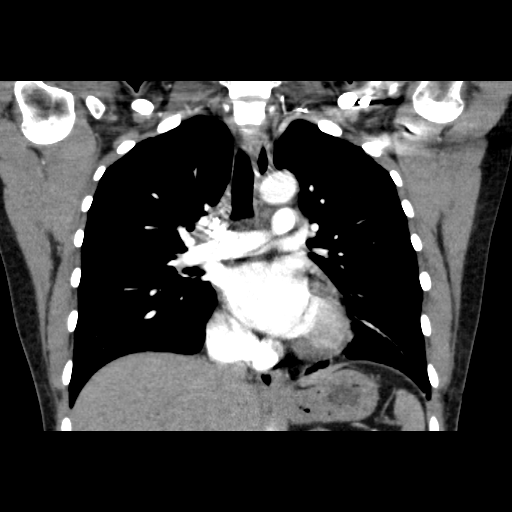

[18 of 36 positions shown; findings below may reference images not displayed]

FINDINGS: No evidence of pulmonary embolism.

Mild patchy/ground-glass opacities at the left lung base (series
6/image 76), suspicious for pneumonia.  No pleural effusion or
pneumothorax.

Visualized thyroid is unremarkable.

The heart is normal in size.  No pericardial effusion.

No suspicious mediastinal, hilar, or axillary lymphadenopathy.

Visualized upper abdomen is unremarkable.

Visualized osseous structures are within normal limits.
IMPRESSION: No evidence of pulmonary embolism.

Mild patchy/ground-glass opacities at the left lung base,
suspicious for pneumonia.

## 2016-01-20 ENCOUNTER — Encounter (HOSPITAL_BASED_OUTPATIENT_CLINIC_OR_DEPARTMENT_OTHER): Payer: Self-pay | Admitting: *Deleted

## 2016-01-20 DIAGNOSIS — R21 Rash and other nonspecific skin eruption: Secondary | ICD-10-CM | POA: Insufficient documentation

## 2016-01-20 NOTE — ED Triage Notes (Signed)
Pt c/o rash to extr x 1 month

## 2016-01-21 ENCOUNTER — Telehealth (HOSPITAL_BASED_OUTPATIENT_CLINIC_OR_DEPARTMENT_OTHER): Payer: Self-pay | Admitting: *Deleted

## 2016-01-21 ENCOUNTER — Emergency Department (HOSPITAL_BASED_OUTPATIENT_CLINIC_OR_DEPARTMENT_OTHER)
Admission: EM | Admit: 2016-01-21 | Discharge: 2016-01-21 | Disposition: A | Discharge status: Home / Self Care | Payer: Medicaid Other | Attending: Emergency Medicine | Admitting: Emergency Medicine

## 2016-01-21 DIAGNOSIS — R21 Rash and other nonspecific skin eruption: Secondary | ICD-10-CM

## 2016-01-21 MED ORDER — PREDNISONE 20 MG PO TABS: 40.0000 mg | ORAL_TABLET | Freq: Every day | ORAL | 0 refills | Status: AC

## 2016-01-21 MED ORDER — DIPHENHYDRAMINE HCL 25 MG PO CAPS
25.0000 mg | ORAL_CAPSULE | Freq: Once | ORAL | Status: AC
  Administered 2016-01-21: 25 mg via ORAL
  Filled 2016-01-21: qty 1

## 2016-01-21 MED ORDER — PREDNISONE 20 MG PO TABS
40.0000 mg | ORAL_TABLET | Freq: Once | ORAL | Status: AC
  Administered 2016-01-21: 40 mg via ORAL
  Filled 2016-01-21: qty 2

## 2016-01-21 MED ORDER — DIPHENHYDRAMINE HCL 25 MG PO CAPS: 25.0000 mg | ORAL_CAPSULE | Freq: Four times a day (QID) | ORAL | 0 refills | Status: AC | PRN

## 2016-01-21 MED FILL — TRIAMCINOLONE 0.1% CREAM: 0.1 | 30 days supply | Qty: 80 | Fill #0

## 2016-01-21 MED FILL — BANOPHEN 25 MG CAPSULE: 25 | 25 days supply | Qty: 100 | Fill #0

## 2016-01-21 MED FILL — predniSONE 20 MG TABS: 20 | 7 days supply | Qty: 14 | Fill #0

## 2016-01-21 NOTE — Discharge Instructions (Signed)
Please read and follow all provided instructions.  Your diagnoses today include:  1. Rash     Tests performed today include: Vital signs. See below for your results today.   Medications prescribed:  Take as prescribed. Use Calamine lotion as well   Home care instructions:  Follow any educational materials contained in this packet.  Follow-up instructions: Please follow-up with Dermatology for further evaluation of symptoms and treatment   Return instructions:  Please return to the Emergency Department if you do not get better, if you get worse, or new symptoms OR  - Fever (temperature greater than 101.73F)  - Bleeding that does not stop with holding pressure to the area    -Severe pain (please note that you may be more sore the day after your accident)  - Chest Pain  - Difficulty breathing  - Severe nausea or vomiting  - Inability to tolerate food and liquids  - Passing out  - Skin becoming red around your wounds  - Change in mental status (confusion or lethargy)  - New numbness or weakness    Please return if you have any other emergent concerns.  Additional Information:  Your vital signs today were: BP 151/76 (BP Location: Left Arm)    Pulse 73    Temp 97.5 F (36.4 C)    Resp 18    Ht 4\' 11"  (1.499 m)    Wt 47.6 kg    LMP 01/13/2016    SpO2 100%    BMI 21.21 kg/m  If your blood pressure (BP) was elevated above 135/85 this visit, please have this repeated by your doctor within one month. ---------------

## 2016-01-21 NOTE — Telephone Encounter (Signed)
Pt returned to ED this morning to get her prescriptions filled from ED visit yesterday at pharmacy. Brought in picture of the medication she was trying to remember the name of yesterday so it could be written for also. Discussed with Dr. Canary Brim. VOR[facility] for Rx: Triamcinolone Acetonide Cream 0.1%; Apply BID PRN to affected area; Dispense 80 grams; No refills; called to [facility]. Pt aware she can pick medicine here with her other prescriptions

## 2016-01-21 NOTE — ED Provider Notes (Signed)
[facility] Provider Note   CSN: [id number] Arrival date & time: 01/20/16  2349  History   Chief Complaint Chief Complaint  Patient presents with  . Rash    HPI Monica Dyer is a 50 y.o. female.  HPI 50 y.o. female presents to the Emergency Department today complaining of rash on upper and lower extremities x 1 month. Itchy and not painful. Notes rash spreading from outside elbows to anterior ankles. Attempted topical steroid cream and itching powder with minimal relief. No fevers. No redness. No purulence from sites. No N/V. No joint pains. No other symptoms noted.   History reviewed. No pertinent past medical history.  There are no active problems to display for this patient.   History reviewed. No pertinent surgical history.  OB History    No data available       Home Medications    Prior to Admission medications   Medication Sig Start Date End Date Taking? Authorizing Provider  diphenhydrAMINE (BENADRYL) 25 mg capsule Take 1 capsule (25 mg total) by mouth every 6 (six) hours as needed. 01/21/16   Shary Decamp, PA-C  fluticasone (CUTIVATE) 0.05 % cream Apply topically 2 (two) times daily. 06/07/13   Billy Fischer, MD  predniSONE (DELTASONE) 20 MG tablet Take 2 tablets (40 mg total) by mouth daily. 01/21/16 01/28/16  Shary Decamp, PA-C    Family History History reviewed. No pertinent family history.  Social History Social History  Substance Use Topics  . Smoking status: Never Smoker  . Smokeless tobacco: Never Used  . Alcohol use No     Allergies   Patient has no known allergies.   Review of Systems Review of Systems  Constitutional: Negative for fever.  Gastrointestinal: Negative for nausea and vomiting.  Skin: Positive for rash.  Allergic/Immunologic: Negative for immunocompromised state.   Physical Exam Updated Vital Signs BP 151/76 (BP Location: Left Arm)   Pulse 73   Temp 97.5 F (36.4 C)   Resp 18   Ht 4\' 11"  (1.499 m)   Wt  47.6 kg   LMP 01/13/2016   SpO2 100%   BMI 21.21 kg/m   Physical Exam  Constitutional: She is oriented to person, place, and time. Vital signs are normal. She appears well-developed and well-nourished.  HENT:  Head: Normocephalic.  Right Ear: Hearing normal.  Left Ear: Hearing normal.  Eyes: Conjunctivae and EOM are normal. Pupils are equal, round, and reactive to light.  Cardiovascular: Normal rate and regular rhythm.   Pulmonary/Chest: Effort normal.  Neurological: She is alert and oriented to person, place, and time.  Skin: Skin is warm and dry.  Plaques notes on extensor surface of left elbow as well as flexor surfaces of bilateral ankles. No erythema. No purulence. No signs of infection.   Psychiatric: She has a normal mood and affect. Her speech is normal and behavior is normal. Thought content normal.  Nursing note and vitals reviewed.  ED Treatments / Results  Labs (all labs ordered are listed, but only abnormal results are displayed) Labs Reviewed - No data to display  EKG  EKG Interpretation None       Radiology No results found.  Procedures Procedures (including critical care time)  Medications Ordered in ED Medications  predniSONE (DELTASONE) tablet 40 mg (not administered)  diphenhydrAMINE (BENADRYL) capsule 25 mg (not administered)     Initial Impression / Assessment and Plan / ED Course  I have reviewed the triage vital signs and the nursing notes.  Pertinent  labs & imaging results that were available during my care of the patient were reviewed by me and considered in my medical decision making (see chart for details).  Clinical Course    Final Clinical Impressions(s) / ED Diagnoses     {I have reviewed the relevant previous healthcare records.  {I obtained HPI from historian.   ED Course:  Assessment: Pt is a 50yF who presents with rash x 1 month. No relief with topical steroids. No fevers. No N/V. No arthralgias. On exam, pt in NAD.  Nontoxic/nonseptic appearing. VSS. Afebrile. Rash on flexor and extensor regions of BUE/BLE. Appears to be psoriatic like. No signs of infection. Given prednisone in ED. Plan is to DC home with follow up to Dermatology. At time of discharge, Patient is in no acute distress. Vital Signs are stable. Patient is able to ambulate. Patient able to tolerate PO.    Disposition/Plan:  DC Home Additional Verbal discharge instructions given and discussed with patient.  Pt Instructed to f/u with PCP in the next week for evaluation and treatment of symptoms. Return precautions given Pt acknowledges and agrees with plan  Supervising Physician Ripley Fraise, MD  Final diagnoses:  Rash    New Prescriptions New Prescriptions   DIPHENHYDRAMINE (BENADRYL) 25 MG CAPSULE    Take 1 capsule (25 mg total) by mouth every 6 (six) hours as needed.   PREDNISONE (DELTASONE) 20 MG TABLET    Take 2 tablets (40 mg total) by mouth daily.     Shary Decamp, PA-C 01/21/16 0025    Ripley Fraise, MD 01/21/16 [phone]

## 2016-06-05 ENCOUNTER — Ambulatory Visit (HOSPITAL_COMMUNITY)
Admission: EM | Admit: 2016-06-05 | Discharge: 2016-06-05 | Disposition: A | Discharge status: Home / Self Care | Payer: Medicaid Other | Attending: Internal Medicine | Admitting: Internal Medicine

## 2016-06-05 ENCOUNTER — Encounter (HOSPITAL_COMMUNITY): Payer: Self-pay | Admitting: Emergency Medicine

## 2016-06-05 DIAGNOSIS — R21 Rash and other nonspecific skin eruption: Secondary | ICD-10-CM

## 2016-06-05 DIAGNOSIS — W57XXXA Bitten or stung by nonvenomous insect and other nonvenomous arthropods, initial encounter: Secondary | ICD-10-CM | POA: Diagnosis not present

## 2016-06-05 DIAGNOSIS — L209 Atopic dermatitis, unspecified: Secondary | ICD-10-CM

## 2016-06-05 MED ORDER — CLOBETASOL PROPIONATE 0.025 % EX CREA: 1.0000 "application " | TOPICAL_CREAM | Freq: Two times a day (BID) | CUTANEOUS | 1 refills | Status: AC

## 2016-06-05 MED ORDER — METHYLPREDNISOLONE SODIUM SUCC 125 MG IJ SOLR
INTRAMUSCULAR | Status: AC
  Filled 2016-06-05: qty 2

## 2016-06-05 MED ORDER — METHYLPREDNISOLONE SODIUM SUCC 125 MG IJ SOLR
125.0000 mg | Freq: Once | INTRAMUSCULAR | Status: AC
  Administered 2016-06-05: 125 mg via INTRAMUSCULAR

## 2016-06-05 MED ORDER — METHYLPREDNISOLONE 4 MG PO TBPK: ORAL_TABLET | ORAL | 0 refills | Status: DC

## 2016-06-05 NOTE — ED Triage Notes (Signed)
Patient has a tick bite to back noticed yesterday.   Rash on ankles

## 2016-06-05 NOTE — Discharge Instructions (Signed)
We gave you a steroid shot today You may start the medrol dosepak tomorrow Apply cream to the large rash twice daily as well; use thin layer.  Please follow up with your primary care doctor for no improvement.

## 2016-06-05 NOTE — ED Provider Notes (Signed)
CSN: [id number]     Arrival date & time 06/05/16  1552 History   First MD Initiated Contact with Patient 06/05/16 1817     Chief Complaint  Patient presents with  . Tick Removal   (Consider location/radiation/quality/duration/timing/severity/associated sxs/prior Treatment) Patient is here for tick bite and rash. She pulled off 5 tick bites off her back and 1 off her right lower abdomen today. She have had multiple tick bites before. She also reports rash to her lower legs of several days and "is spreading because I keep scratching at it". Patient reports the rash to be very itchy. She denies any systematic symptoms such as fever, headache, body ache, dizziness. She have had these rash before.        History reviewed. No pertinent past medical history. History reviewed. No pertinent surgical history. No family history on file. Social History  Substance Use Topics  . Smoking status: Never Smoker  . Smokeless tobacco: Never Used  . Alcohol use No   OB History    No data available     Review of Systems  Constitutional:       See HPI    Allergies  Patient has no known allergies.  Home Medications   Prior to Admission medications   Medication Sig Start Date End Date Taking? Authorizing Provider  Clobetasol Propionate 0.025 % CREA Apply 1 application topically 2 (two) times daily. 06/05/16 06/12/16  Barry Dienes, NP  diphenhydrAMINE (BENADRYL) 25 mg capsule Take 1 capsule (25 mg total) by mouth every 6 (six) hours as needed. 01/21/16   Shary Decamp, PA-C  fluticasone (CUTIVATE) 0.05 % cream Apply topically 2 (two) times daily. 06/07/13   Billy Fischer, MD  methylPREDNISolone (MEDROL DOSEPAK) 4 MG TBPK tablet Take as directed 06/05/16   Barry Dienes, NP   Meds Ordered and Administered this Visit   Medications  methylPREDNISolone sodium succinate (SOLU-MEDROL) 125 mg/2 mL injection 125 mg (125 mg Intramuscular Given 06/05/16 1830)    BP 128/77 (BP Location: Left Arm)   Pulse 69    Temp 98 F (36.7 C) (Oral)   Resp (!) 1   LMP 05/15/2016   SpO2 98%  No data found.   Physical Exam  Constitutional: She appears well-developed and well-nourished.  Cardiovascular: Normal rate, regular rhythm and normal heart sounds.   Pulmonary/Chest: Effort normal and breath sounds normal. She has no wheezes.  Skin:  See picture. No EM rash noted. Rash on left is dry and scaly.   Nursing note and vitals reviewed.             Urgent Care Course     Procedures (including critical care time)  Labs Review Labs Reviewed - No data to display  Imaging Review No results found.   MDM   1. Atopic dermatitis, unspecified type   2. Tick bite, initial encounter    Tick bite: Seems to be localized reaction from the tick bite. Continue to monitor for symptoms.   Rash: Rash on leg and ankle appears to be atopic dermatitis, will treat with medrol dosepak and apply clobetasol cream BID x 7 days as well. May apply Clobetasol to the tick bite as well.   Informed to f/u with PCP for no improvement.     Barry Dienes, NP 06/05/16 [phone]

## 2021-12-11 ENCOUNTER — Encounter: Payer: Self-pay | Admitting: Family Medicine

## 2021-12-24 ENCOUNTER — Encounter: Payer: Self-pay | Admitting: Gastroenterology

## 2022-01-14 ENCOUNTER — Ambulatory Visit (AMBULATORY_SURGERY_CENTER): Payer: Medicaid Other | Admitting: *Deleted

## 2022-01-14 VITALS — Ht 59.0 in | Wt 105.0 lb

## 2022-01-14 DIAGNOSIS — Z1211 Encounter for screening for malignant neoplasm of colon: Secondary | ICD-10-CM

## 2022-01-14 MED ORDER — NA SULFATE-K SULFATE-MG SULF 17.5-3.13-1.6 GM/177ML PO SOLN: 1.0000 | Freq: Once | ORAL | 0 refills | Status: AC

## 2022-01-14 NOTE — Progress Notes (Signed)
Previsit via phone Instructions sent via mail to verified address No egg or soy allergy known to patient  No issues known to pt with past sedation with any surgeries or procedures Patient denies ever being told they had issues or difficulty with intubation  No issues with moving neck or swallowing No FH of Malignant Hyperthermia Pt is not on diet pills Pt is not on  home 02  Pt is not on blood thinners  Pt denies issues with constipation  Pt encouraged to use to use Singlecare or Goodrx to reduce cost  Patient's chart reviewed by Osvaldo Angst CNRA prior to previsit and patient appropriate for the [facility].  Previsit completed and red dot placed by patient's name on their procedure day (on provider's schedule).  Marland Kitchen

## 2022-01-27 ENCOUNTER — Telehealth: Payer: Self-pay | Admitting: Gastroenterology

## 2022-01-27 NOTE — Telephone Encounter (Signed)
Please advise patient on prep for procedure 1/4

## 2022-01-27 NOTE — Telephone Encounter (Signed)
Spoke with pt- She is unable to prep at work tonight.  I rescheduled her for 02-01-22 at 11:30 am.  She will come to the office tomorrow and I will review new colonoscopy prep instructions with her.

## 2022-01-28 ENCOUNTER — Encounter: Payer: Self-pay | Admitting: Gastroenterology

## 2022-01-30 ENCOUNTER — Encounter: Payer: Self-pay | Admitting: Certified Registered Nurse Anesthetist

## 2022-02-01 ENCOUNTER — Telehealth: Payer: Self-pay | Admitting: *Deleted

## 2022-02-01 ENCOUNTER — Encounter: Payer: Self-pay | Admitting: Gastroenterology

## 2022-02-01 ENCOUNTER — Ambulatory Visit (AMBULATORY_SURGERY_CENTER): Payer: Medicaid Other | Admitting: Gastroenterology

## 2022-02-01 VITALS — BP 123/69 | HR 59 | Temp 96.8°F | Resp 10 | Ht 59.0 in | Wt 105.0 lb

## 2022-02-01 DIAGNOSIS — D125 Benign neoplasm of sigmoid colon: Secondary | ICD-10-CM

## 2022-02-01 DIAGNOSIS — Z1211 Encounter for screening for malignant neoplasm of colon: Secondary | ICD-10-CM

## 2022-02-01 DIAGNOSIS — Z538 Procedure and treatment not carried out for other reasons: Secondary | ICD-10-CM | POA: Diagnosis not present

## 2022-02-01 DIAGNOSIS — K635 Polyp of colon: Secondary | ICD-10-CM

## 2022-02-01 MED ORDER — SODIUM CHLORIDE 0.9 % IV SOLN: 500.0000 mL | INTRAVENOUS | Status: DC

## 2022-02-01 NOTE — Progress Notes (Unsigned)
Report given to PACU, vss 

## 2022-02-01 NOTE — Progress Notes (Unsigned)
[facility] History and Physical   Primary Care Physician:  Patient, No Pcp Per   Reason for Procedure:  Colorectal cancer screening  Plan:    Screening colonoscopy with possible interventions as needed     HPI: Monica Dyer is a very pleasant 57 y.o. female here for screening colonoscopy. Denies any nausea, vomiting, abdominal pain, melena or bright red blood per rectum  The risks and benefits as well as alternatives of endoscopic procedure(s) have been discussed and reviewed. All questions answered. The patient agrees to proceed.    History reviewed. No pertinent past medical history.  History reviewed. No pertinent surgical history.  Prior to Admission medications   Medication Sig Start Date End Date Taking? Authorizing Provider  acetaminophen (TYLENOL) 325 MG tablet Take by mouth. 07/12/21  Yes [provider]  cetirizine (ZYRTEC) 10 MG tablet Take by mouth.   Yes [provider]  diphenhydrAMINE (BENADRYL) 25 mg capsule Take 1 capsule (25 mg total) by mouth every 6 (six) hours as needed. 01/21/16   Shary Decamp, PA-C  fluticasone (CUTIVATE) 0.05 % cream Apply topically 2 (two) times daily. 06/07/13   Billy Fischer, MD    Current Outpatient Medications  Medication Sig Dispense Refill   acetaminophen (TYLENOL) 325 MG tablet Take by mouth.     cetirizine (ZYRTEC) 10 MG tablet Take by mouth.     diphenhydrAMINE (BENADRYL) 25 mg capsule Take 1 capsule (25 mg total) by mouth every 6 (six) hours as needed. 30 capsule 0   fluticasone (CUTIVATE) 0.05 % cream Apply topically 2 (two) times daily. 30 g 1   Current Facility-Administered Medications  Medication Dose Route Frequency Provider Last Rate Last Admin   0.9 %  sodium chloride infusion  500 mL Intravenous Continuous Adrie Picking, Venia Minks, MD        Allergies as of 02/01/2022   (No Known Allergies)    Family History  Problem Relation Age of Onset   Colon cancer Neg Hx    Colon polyps  Neg Hx    Esophageal cancer Neg Hx    Rectal cancer Neg Hx    Stomach cancer Neg Hx     Social History   Socioeconomic History   Marital status: Single    Spouse name: Not on file   Number of children: Not on file   Years of education: Not on file   Highest education level: Not on file  Occupational History   Not on file  Tobacco Use   Smoking status: Never   Smokeless tobacco: Never  Vaping Use   Vaping Use: Never used  Substance and Sexual Activity   Alcohol use: No   Drug use: No   Sexual activity: Not on file  Other Topics Concern   Not on file  Social History Narrative   Not on file   Social Determinants of Health   Financial Resource Strain: Not on file  Food Insecurity: Not on file  Transportation Needs: Not on file  Physical Activity: Not on file  Stress: Not on file  Social Connections: Not on file  Intimate Partner Violence: Not on file    Review of Systems:  All other review of systems negative except as mentioned in the HPI.  Physical Exam: Vital signs in last 24 hours: Blood Pressure (Abnormal) 144/89   Pulse 65   Temperature (Abnormal) 96.8 F (36 C)   Height '4\' 11"'$  (1.499 m)   Weight 105 lb (47.6 kg)   Last Menstrual Period 04/25/2020  Oxygen Saturation 95%   Body Mass Index 21.21 kg/m  General:   Alert, NAD Lungs:  Clear .   Heart:  Regular rate and rhythm Abdomen:  Soft, nontender and nondistended. Neuro/Psych:  Alert and cooperative. Normal mood and affect. A and O x 3  Reviewed labs, radiology imaging, old records and pertinent past GI work up  Patient is appropriate for planned procedure(s) and anesthesia in an ambulatory setting   K. Denzil Magnuson , MD [phone]

## 2022-02-01 NOTE — Progress Notes (Unsigned)
Called to room to assist during endoscopic procedure.  Patient ID and intended procedure confirmed with present staff. Received instructions for my participation in the procedure from the performing physician.  

## 2022-02-01 NOTE — Telephone Encounter (Signed)
Prep instructions reviewed with patient and daughter. He was rescheduled for her colonscopy due to poor prep on 03/10/22 at 8 am.

## 2022-02-01 NOTE — Op Note (Signed)
[facility] Patient Name: Monica Dyer Procedure Date: 02/01/2022 11:56 AM MRN: [id number] Endoscopist: Mauri Pole , MD, [id number] Age: 57 Referring MD:  Date of Birth: 04-13-1965 Gender: Female Account #: [id number] Procedure:                Colonoscopy Indications:              Screening for colorectal malignant neoplasm Medicines:                Monitored Anesthesia Care Procedure:                Pre-Anesthesia Assessment:                           - Prior to the procedure, a History and Physical                            was performed, and patient medications and                            allergies were reviewed. The patient's tolerance of                            previous anesthesia was also reviewed. The risks                            and benefits of the procedure and the sedation                            options and risks were discussed with the patient.                            All questions were answered, and informed consent                            was obtained. Prior Anticoagulants: The patient has                            taken no anticoagulant or antiplatelet agents. ASA                            Grade Assessment: I - A normal, healthy patient.                            After reviewing the risks and benefits, the patient                            was deemed in satisfactory condition to undergo the                            procedure.                           After obtaining informed consent, the colonoscope  was passed under direct vision. Throughout the                            procedure, the patient's blood pressure, pulse, and                            oxygen saturations were monitored continuously. The                            Olympus PCF-H190DL ([id number]) Colonoscope was                            introduced through the anus and advanced to the the                            cecum,  identified by the appendiceal orifice,                            ileocecal valve and palpation. The colonoscopy was                            performed without difficulty. The patient tolerated                            the procedure well. The quality of the bowel                            preparation was poor. The ileocecal valve,                            appendiceal orifice, and rectum were photographed. Scope In: 12:09:47 PM Scope Out: 12:18:37 PM Scope Withdrawal Time: 0 hours 3 minutes 22 seconds  Total Procedure Duration: 0 hours 8 minutes 50 seconds  Findings:                 The perianal and digital rectal examinations were                            normal.                           A large amount of stool was found in the transverse                            colon, in the ascending colon and in the cecum,                            interfering with visualization. Lavage of the area                            was performed, resulting in incomplete clearance                            with continued poor visualization.  Two sessile polyps were found in the sigmoid colon.                            The polyps were 3 to 4 mm in size. These polyps                            were removed with a cold snare. Resection and                            retrieval were complete.                           Non-bleeding internal hemorrhoids were found during                            retroflexion. The hemorrhoids were small. Complications:            No immediate complications. Estimated Blood Loss:     Estimated blood loss was minimal. Impression:               - Preparation of the colon was poor.                           - Stool in the transverse colon, in the ascending                            colon and in the cecum.                           - Two 3 to 4 mm polyps in the sigmoid colon,                            removed with a cold snare. Resected and  retrieved.                           - Non-bleeding internal hemorrhoids. Recommendation:           - Patient has a contact number available for                            emergencies. The signs and symptoms of potential                            delayed complications were discussed with the                            patient. Return to normal activities tomorrow.                            Written discharge instructions were provided to the                            patient.                           -  Resume previous diet.                           - Continue present medications.                           - Await pathology results.                           - Repeat colonoscopy at the next available                            appointment because the bowel preparation was                            suboptimal. Mauri Pole, MD 02/01/2022 12:23:37 PM This report has been signed electronically.

## 2022-02-01 NOTE — Patient Instructions (Signed)
YOU HAD AN ENDOSCOPIC PROCEDURE TODAY AT THE [facility]:   Refer to the procedure report that was given to you for any specific questions about what was found during the examination.  If the procedure report does not answer your questions, please call your gastroenterologist to clarify.  If you requested that your care partner not be given the details of your procedure findings, then the procedure report has been included in a sealed envelope for you to review at your convenience later.  YOU SHOULD EXPECT: Some feelings of bloating in the abdomen. Passage of more gas than usual.  Walking can help get rid of the air that was put into your GI tract during the procedure and reduce the bloating. If you had a lower endoscopy (such as a colonoscopy or flexible sigmoidoscopy) you may notice spotting of blood in your stool or on the toilet paper. If you underwent a bowel prep for your procedure, you may not have a normal bowel movement for a few days.  Please Note:  You might notice some irritation and congestion in your nose or some drainage.  This is from the oxygen used during your procedure.  There is no need for concern and it should clear up in a day or so.  SYMPTOMS TO REPORT IMMEDIATELY:  Following lower endoscopy (colonoscopy or flexible sigmoidoscopy):  Excessive amounts of blood in the stool  Significant tenderness or worsening of abdominal pains  Swelling of the abdomen that is new, acute  Fever of 100F or higher  For urgent or emergent issues, a gastroenterologist can be reached at any hour by calling [phone]. Do not use MyChart messaging for urgent concerns.    DIET:  We do recommend a small meal at first, but then you may proceed to your regular diet.  Drink plenty of fluids but you should avoid alcoholic beverages for 24 hours.  ACTIVITY:  You should plan to take it easy for the rest of today and you should NOT DRIVE or use heavy machinery until tomorrow (because of  the sedation medicines used during the test).    FOLLOW UP: Our staff will call the number listed on your records the next business day following your procedure.  We will call around 7:15- 8:00 am to check on you and address any questions or concerns that you may have regarding the information given to you following your procedure. If we do not reach you, we will leave a message.     If any biopsies were taken you will be contacted by phone or by letter within the next 1-3 weeks.  Please call us at [phone] if you have not heard about the biopsies in 3 weeks.    SIGNATURES/CONFIDENTIALITY: You and/or your care partner have signed paperwork which will be entered into your electronic medical record.  These signatures attest to the fact that that the information above on your After Visit Summary has been reviewed and is understood.  Full responsibility of the confidentiality of this discharge information lies with you and/or your care-partner.  

## 2022-02-02 ENCOUNTER — Encounter: Payer: Self-pay | Admitting: Gastroenterology

## 2022-02-02 ENCOUNTER — Telehealth: Payer: Self-pay | Admitting: *Deleted

## 2022-02-02 NOTE — Telephone Encounter (Signed)
  Follow up Call-     02/01/2022   11:10 AM  Call back number  Post procedure Call Back phone  # [phone]  Permission to leave phone message Yes     Patient questions:  Do you have a fever, pain , or abdominal swelling? No. Pain Score  0 *  Have you tolerated food without any problems? Yes.    Have you been able to return to your normal activities? Yes.    Do you have any questions about your discharge instructions: Diet   No. Medications  No. Follow up visit  No.  Do you have questions or concerns about your Care? No.  Actions: * If pain score is 4 or above: No action needed, pain <4.

## 2022-02-11 ENCOUNTER — Encounter: Payer: Self-pay | Admitting: Gastroenterology

## 2022-03-08 ENCOUNTER — Telehealth: Payer: Self-pay | Admitting: Gastroenterology

## 2022-03-08 ENCOUNTER — Other Ambulatory Visit: Payer: Self-pay

## 2022-03-08 DIAGNOSIS — Z1211 Encounter for screening for malignant neoplasm of colon: Secondary | ICD-10-CM

## 2022-03-08 NOTE — Telephone Encounter (Signed)
Spoke with patient and she wanted the Suprep called into pharmacy with new prep instructions.  Patient verbalized understanding of prep instructions.  Patient will come pick up instructions from 2nd floor reception desk.

## 2022-03-08 NOTE — Telephone Encounter (Signed)
Patient returning call. Please advise

## 2022-03-08 NOTE — Telephone Encounter (Signed)
Inbound call from patient, states she was not given a prescription for the prep medication when she was rescheduled due to poor prep. Patient is requesting prep for procedure on 2/14.

## 2022-03-08 NOTE — Telephone Encounter (Signed)
Attempted to return pt call. VM to call back.

## 2022-03-08 NOTE — Telephone Encounter (Signed)
Patient is returning your call.  

## 2022-03-08 NOTE — Telephone Encounter (Signed)
Attempted to return pt call regarding prep. Left VM to call back.

## 2022-03-09 ENCOUNTER — Telehealth: Payer: Self-pay

## 2022-03-09 ENCOUNTER — Other Ambulatory Visit: Payer: Self-pay

## 2022-03-09 DIAGNOSIS — Z1211 Encounter for screening for malignant neoplasm of colon: Secondary | ICD-10-CM

## 2022-03-09 MED ORDER — NA SULFATE-K SULFATE-MG SULF 17.5-3.13-1.6 GM/177ML PO SOLN: 1.0000 | Freq: Once | ORAL | 0 refills | Status: DC

## 2022-03-09 MED ORDER — NA SULFATE-K SULFATE-MG SULF 17.5-3.13-1.6 GM/177ML PO SOLN: 1.0000 | Freq: Once | ORAL | 0 refills | Status: AC

## 2022-03-09 NOTE — Telephone Encounter (Signed)
Spoke with patient and pharmacy to verify that [facility] request came through.  Prep re-sent to [facility] on [address].

## 2022-03-09 NOTE — Telephone Encounter (Signed)
Sent rx for [facility] to [facility] [address], call to pt and let her know

## 2022-03-09 NOTE — Telephone Encounter (Signed)
Patient's pharmacy has not received her prep medication. Please advise. [place]

## 2022-03-10 ENCOUNTER — Ambulatory Visit (AMBULATORY_SURGERY_CENTER): Payer: Medicaid Other | Admitting: Gastroenterology

## 2022-03-10 ENCOUNTER — Encounter: Payer: Self-pay | Admitting: Gastroenterology

## 2022-03-10 VITALS — BP 115/78 | HR 53 | Temp 98.4°F | Resp 16 | Ht 59.0 in | Wt 105.0 lb

## 2022-03-10 DIAGNOSIS — Z1211 Encounter for screening for malignant neoplasm of colon: Secondary | ICD-10-CM | POA: Diagnosis not present

## 2022-03-10 DIAGNOSIS — D125 Benign neoplasm of sigmoid colon: Secondary | ICD-10-CM | POA: Diagnosis not present

## 2022-03-10 DIAGNOSIS — K635 Polyp of colon: Secondary | ICD-10-CM | POA: Diagnosis not present

## 2022-03-10 MED ORDER — SODIUM CHLORIDE 0.9 % IV SOLN: 500.0000 mL | INTRAVENOUS | Status: DC

## 2022-03-10 NOTE — Progress Notes (Signed)
[facility] History and Physical   Primary Care Physician:  Katherina Mires, MD   Reason for Procedure:  Colorectal cancer screening  Plan:    Screening colonoscopy with possible interventions as needed     HPI: Monica Dyer is a very pleasant 57 y.o. female here for screening colonoscopy. Denies any nausea, vomiting, abdominal pain, melena or bright red blood per rectum  The risks and benefits as well as alternatives of endoscopic procedure(s) have been discussed and reviewed. All questions answered. The patient agrees to proceed.    History reviewed. No pertinent past medical history.  History reviewed. No pertinent surgical history.  Prior to Admission medications   Medication Sig Start Date End Date Taking? Authorizing Provider  acetaminophen (TYLENOL) 325 MG tablet Take by mouth. 07/12/21  Yes [provider]  cetirizine (ZYRTEC) 10 MG tablet Take by mouth.   Yes [provider]  diphenhydrAMINE (BENADRYL) 25 mg capsule Take 1 capsule (25 mg total) by mouth every 6 (six) hours as needed. 01/21/16  Yes Shary Decamp, PA-C  fluticasone (CUTIVATE) 0.05 % cream Apply topically 2 (two) times daily. 06/07/13  Yes KindlNelda Severe, MD    Current Outpatient Medications  Medication Sig Dispense Refill   acetaminophen (TYLENOL) 325 MG tablet Take by mouth.     cetirizine (ZYRTEC) 10 MG tablet Take by mouth.     diphenhydrAMINE (BENADRYL) 25 mg capsule Take 1 capsule (25 mg total) by mouth every 6 (six) hours as needed. 30 capsule 0   fluticasone (CUTIVATE) 0.05 % cream Apply topically 2 (two) times daily. 30 g 1   Current Facility-Administered Medications  Medication Dose Route Frequency Provider Last Rate Last Admin   0.9 %  sodium chloride infusion  500 mL Intravenous Continuous Yannis Gumbs, Venia Minks, MD        Allergies as of 03/10/2022   (No Known Allergies)    Family History  Problem Relation Age of Onset   Colon cancer Neg Hx    Colon  polyps Neg Hx    Esophageal cancer Neg Hx    Rectal cancer Neg Hx    Stomach cancer Neg Hx     Social History   Socioeconomic History   Marital status: Single    Spouse name: Not on file   Number of children: Not on file   Years of education: Not on file   Highest education level: Not on file  Occupational History   Not on file  Tobacco Use   Smoking status: Never   Smokeless tobacco: Never  Vaping Use   Vaping Use: Never used  Substance and Sexual Activity   Alcohol use: No   Drug use: No   Sexual activity: Not on file  Other Topics Concern   Not on file  Social History Narrative   Not on file   Social Determinants of Health   Financial Resource Strain: Not on file  Food Insecurity: Not on file  Transportation Needs: Not on file  Physical Activity: Not on file  Stress: Not on file  Social Connections: Not on file  Intimate Partner Violence: Not on file    Review of Systems:  All other review of systems negative except as mentioned in the HPI.  Physical Exam: Vital signs in last 24 hours: Blood Pressure 126/76   Pulse 60   Temperature 98.4 F (36.9 C) (Temporal)   Height 4' 11"$  (1.499 m)   Weight 105 lb (47.6 kg)   Last Menstrual Period 04/25/2020  Oxygen Saturation 97%   Body Mass Index 21.21 kg/m  General:   Alert, NAD Lungs:  Clear .   Heart:  Regular rate and rhythm Abdomen:  Soft, nontender and nondistended. Neuro/Psych:  Alert and cooperative. Normal mood and affect. A and O x 3  Reviewed labs, radiology imaging, old records and pertinent past GI work up  Patient is appropriate for planned procedure(s) and anesthesia in an ambulatory setting   K. Denzil Magnuson , MD [phone]

## 2022-03-10 NOTE — Progress Notes (Signed)
Pt's states no medical or surgical changes since previsit or office visit. 

## 2022-03-10 NOTE — Op Note (Signed)
[facility] Patient Name: Monica Dyer Procedure Date: 03/10/2022 8:05 AM MRN: [id number] Endoscopist: Mauri Pole , MD, [id number] Age: 57 Referring MD:  Date of Birth: 1965-10-02 Gender: Female Account #: [id number] Procedure:                Colonoscopy Indications:              Screening for colorectal malignant neoplasm Medicines:                Monitored Anesthesia Care Procedure:                Pre-Anesthesia Assessment:                           - Prior to the procedure, a History and Physical                            was performed, and patient medications and                            allergies were reviewed. The patient's tolerance of                            previous anesthesia was also reviewed. The risks                            and benefits of the procedure and the sedation                            options and risks were discussed with the patient.                            All questions were answered, and informed consent                            was obtained. Prior Anticoagulants: The patient has                            taken no anticoagulant or antiplatelet agents. ASA                            Grade Assessment: I - A normal, healthy patient.                            After reviewing the risks and benefits, the patient                            was deemed in satisfactory condition to undergo the                            procedure.                           After obtaining informed consent, the colonoscope  was passed under direct vision. Throughout the                            procedure, the patient's blood pressure, pulse, and                            oxygen saturations were monitored continuously. The                            Olympus PCF-H190DL [id number]) Colonoscope was                            introduced through the anus and advanced to the the                            cecum,  identified by appendiceal orifice and                            ileocecal valve. The colonoscopy was performed                            without difficulty. The patient tolerated the                            procedure well. The quality of the bowel                            preparation was excellent. The ileocecal valve,                            appendiceal orifice, and rectum were photographed. Scope In: 8:15:29 AM Scope Out: 8:30:15 AM Scope Withdrawal Time: 0 hours 10 minutes 52 seconds  Total Procedure Duration: 0 hours 14 minutes 46 seconds  Findings:                 The perianal and digital rectal examinations were                            normal.                           A 3 mm polyp was found in the sigmoid colon. The                            polyp was sessile. The polyp was removed with a                            cold snare. Resection and retrieval were complete.                           A few small-mouthed diverticula were found in the                            sigmoid colon and descending colon.  Non-bleeding external and internal hemorrhoids were                            found during retroflexion. The hemorrhoids were                            small. Complications:            No immediate complications. Estimated Blood Loss:     Estimated blood loss was minimal. Impression:               - One 3 mm polyp in the sigmoid colon, removed with                            a cold snare. Resected and retrieved.                           - Diverticulosis in the sigmoid colon and in the                            descending colon.                           - Non-bleeding external and internal hemorrhoids. Recommendation:           - Patient has a contact number available for                            emergencies. The signs and symptoms of potential                            delayed complications were discussed with the                             patient. Return to normal activities tomorrow.                            Written discharge instructions were provided to the                            patient.                           - Resume previous diet.                           - Continue present medications.                           - Await pathology results.                           - Repeat colonoscopy in 7-10 years for surveillance                            based on pathology results. Mauri Pole, MD 03/10/2022 8:35:50 AM This report has been signed electronically.

## 2022-03-10 NOTE — Progress Notes (Signed)
Sedate, gd SR, tolerated procedure well, VSS, report to RN 

## 2022-03-10 NOTE — Patient Instructions (Signed)
Handout on polyps and diverticulosis given. Resume previous diet and continue present medications.     YOU HAD AN ENDOSCOPIC PROCEDURE TODAY AT [facility]:   Refer to the procedure report that was given to you for any specific questions about what was found during the examination.  If the procedure report does not answer your questions, please call your gastroenterologist to clarify.  If you requested that your care partner not be given the details of your procedure findings, then the procedure report has been included in a sealed envelope for you to review at your convenience later.  YOU SHOULD EXPECT: Some feelings of bloating in the abdomen. Passage of more gas than usual.  Walking can help get rid of the air that was put into your GI tract during the procedure and reduce the bloating. If you had a lower endoscopy (such as a colonoscopy or flexible sigmoidoscopy) you may notice spotting of blood in your stool or on the toilet paper. If you underwent a bowel prep for your procedure, you may not have a normal bowel movement for a few days.  Please Note:  You might notice some irritation and congestion in your nose or some drainage.  This is from the oxygen used during your procedure.  There is no need for concern and it should clear up in a day or so.  SYMPTOMS TO REPORT IMMEDIATELY:  Following lower endoscopy (colonoscopy or flexible sigmoidoscopy):  Excessive amounts of blood in the stool  Significant tenderness or worsening of abdominal pains  Swelling of the abdomen that is new, acute  Fever of 100F or higher   For urgent or emergent issues, a gastroenterologist can be reached at any hour by calling [phone]. Do not use MyChart messaging for urgent concerns.    DIET:  We do recommend a small meal at first, but then you may proceed to your regular diet.  Drink plenty of fluids but you should avoid alcoholic beverages for 24 hours.  ACTIVITY:  You should plan to  take it easy for the rest of today and you should NOT DRIVE or use heavy machinery until tomorrow (because of the sedation medicines used during the test).    FOLLOW UP: Our staff will call the number listed on your records the next business day following your procedure.  We will call around 7:15- 8:00 am to check on you and address any questions or concerns that you may have regarding the information given to you following your procedure. If we do not reach you, we will leave a message.     If any biopsies were taken you will be contacted by phone or by letter within the next 1-3 weeks.  Please call us at [phone] if you have not heard about the biopsies in 3 weeks.    SIGNATURES/CONFIDENTIALITY: You and/or your care partner have signed paperwork which will be entered into your electronic medical record.  These signatures attest to the fact that that the information above on your After Visit Summary has been reviewed and is understood.  Full responsibility of the confidentiality of this discharge information lies with you and/or your care-partner.

## 2022-03-11 ENCOUNTER — Telehealth: Payer: Self-pay | Admitting: *Deleted

## 2022-03-11 NOTE — Telephone Encounter (Signed)
  Follow up Call-     03/10/2022    7:19 AM 02/01/2022   11:10 AM  Call back number  Post procedure Call Back phone  # [phone]  Permission to leave phone message Yes Yes     Patient questions:  Do you have a fever, pain , or abdominal swelling? No. Pain Score  0 *  Have you tolerated food without any problems? Yes.    Have you been able to return to your normal activities? Yes.    Do you have any questions about your discharge instructions: Diet   No. Medications  No. Follow up visit  No.  Do you have questions or concerns about your Care? No.  Actions: * If pain score is 4 or above: No action needed, pain <4.

## 2022-03-16 ENCOUNTER — Encounter: Payer: Self-pay | Admitting: Gastroenterology

## 2022-05-29 ENCOUNTER — Emergency Department (HOSPITAL_COMMUNITY)
Admission: EM | Admit: 2022-05-29 | Discharge: 2022-05-30 | Disposition: A | Discharge status: Home / Self Care | Payer: BC Managed Care – PPO | Attending: Emergency Medicine | Admitting: Emergency Medicine

## 2022-05-29 ENCOUNTER — Other Ambulatory Visit: Payer: Self-pay

## 2022-05-29 ENCOUNTER — Encounter (HOSPITAL_COMMUNITY): Payer: Self-pay | Admitting: *Deleted

## 2022-05-29 DIAGNOSIS — L237 Allergic contact dermatitis due to plants, except food: Secondary | ICD-10-CM

## 2022-05-29 DIAGNOSIS — R21 Rash and other nonspecific skin eruption: Secondary | ICD-10-CM

## 2022-05-29 MED ORDER — DEXAMETHASONE SODIUM PHOSPHATE 10 MG/ML IJ SOLN
10.0000 mg | Freq: Once | INTRAMUSCULAR | Status: AC
  Administered 2022-05-30: 10 mg via INTRAMUSCULAR
  Filled 2022-05-29: qty 1

## 2022-05-29 MED ORDER — DOXYCYCLINE HYCLATE 100 MG PO TABS
100.0000 mg | ORAL_TABLET | Freq: Once | ORAL | Status: AC
  Administered 2022-05-30: 100 mg via ORAL
  Filled 2022-05-29: qty 1

## 2022-05-29 NOTE — ED Notes (Signed)
Benadryl po was offered but the pt wants a shot

## 2022-05-29 NOTE — ED Triage Notes (Signed)
She has a rash and itching all night  she was working in the yard yesterday  she has been iitching since then  she thinks its a tick or poison ivy

## 2022-05-29 NOTE — ED Provider Notes (Signed)
[facility] Emergency Department Provider Note MRN:  [id number]  Arrival date & time: 05/30/22     Chief Complaint   Rash   History of Present Illness   Monica Dyer is a 57 y.o. year-old female presents to the ED with chief complaint of rash.  She states that she was working in the yard yesterday and believes she came in contact with poison ivy.  She also thinks that she was bitten by a tick or multiple ticks.  She denies history of diabetes.  She denies any successful treatments prior to arrival.  She has tried using triamcinolone cream.  No reported fevers or arthralgias.  History provided by patient.   Review of Systems  Pertinent positive and negative review of systems noted in HPI.    Physical Exam   Vitals:   05/29/22 2046  BP: (!) 140/116  Pulse: 70  Resp: 18  Temp: 98.2 F (36.8 C)  SpO2: 96%    CONSTITUTIONAL:  non toxic-appearing, NAD NEURO:  Alert and oriented x 3, CN 3-12 grossly intact EYES:  eyes equal and reactive ENT/NECK:  Supple, no stridor  CARDIO:  normal rate, regular rhythm, appears well-perfused  PULM:  No respiratory distress,  GI/GU:  non-distended,  MSK/SPINE:  No gross deformities, no edema, moves all extremities  SKIN:  scattered bug bites, rash present   *Additional and/or pertinent findings included in MDM below  Diagnostic and Interventional Summary    EKG Interpretation  Date/Time:    Ventricular Rate:    PR Interval:    QRS Duration:   QT Interval:    QTC Calculation:   R Axis:     Text Interpretation:         Labs Reviewed - No data to display  No orders to display    Medications  dexamethasone (DECADRON) injection 10 mg (has no administration in time range)  doxycycline (VIBRA-TABS) tablet 100 mg (has no administration in time range)     Procedures  /  Critical Care Procedures  ED Course and Medical Decision Making  I have reviewed the triage vital signs, the nursing notes, and  pertinent available records from the EMR.  Social Determinants Affecting Complexity of Care: Patient has no clinically significant social determinants affecting this chief complaint..   ED Course:    Medical Decision Making Patient's symptoms seem consistent with poison ivy exposure.  She was working in the yard yesterday.  She does have a few scattered bug bites on her extremities.  Uncertain if any of these were ticks but she is convinced that she was bitten multiple times by ticks.  Will cover with doxy, though my suspicion for tickborne illness is low.  Risk Prescription drug management.     Consultants: No consultations were needed in caring for this patient.   Treatment and Plan: Emergency department workup does not suggest an emergent condition requiring admission or immediate intervention beyond  what has been performed at this time. The patient is safe for discharge and has  been instructed to return immediately for worsening symptoms, change in  symptoms or any other concerns    Final Clinical Impressions(s) / ED Diagnoses     ICD-10-CM   1. Rash  R21     2. Allergic contact dermatitis due to plants, except food  L23.7       ED Discharge Orders          Ordered    predniSONE (DELTASONE) 20 MG tablet  05/30/22 0000    doxycycline (VIBRAMYCIN) 100 MG capsule  2 times daily        05/30/22 0000    triamcinolone cream (KENALOG) 0.1 %  2 times daily        05/30/22 0000              Discharge Instructions Discussed with and Provided to Patient:   Discharge Instructions   None      Roxy Horseman, PA-C 05/30/22 0002    Sloan Leiter, DO 05/30/22 [phone]

## 2022-05-30 DIAGNOSIS — L237 Allergic contact dermatitis due to plants, except food: Secondary | ICD-10-CM | POA: Diagnosis not present

## 2022-05-30 MED ORDER — DOXYCYCLINE HYCLATE 100 MG PO CAPS: 100.0000 mg | ORAL_CAPSULE | Freq: Two times a day (BID) | ORAL | 0 refills | Status: AC

## 2022-05-30 MED ORDER — PREDNISONE 20 MG PO TABS: ORAL_TABLET | ORAL | 0 refills | Status: AC

## 2022-05-30 MED ORDER — TRIAMCINOLONE ACETONIDE 0.1 % EX CREA: 1.0000 | TOPICAL_CREAM | Freq: Two times a day (BID) | CUTANEOUS | 0 refills | Status: AC
